# Patient Record
Sex: Male | Born: 1965
Health system: Southern US, Community
[De-identification: ages and names within clinical notes are randomized; demographics above are authoritative.]

## PROBLEM LIST (undated history)

## (undated) DIAGNOSIS — G4733 Obstructive sleep apnea (adult) (pediatric): Secondary | ICD-10-CM

## (undated) DIAGNOSIS — Z9989 Dependence on other enabling machines and devices: Secondary | ICD-10-CM

## (undated) DIAGNOSIS — I10 Essential (primary) hypertension: Secondary | ICD-10-CM

## (undated) DIAGNOSIS — G4730 Sleep apnea, unspecified: Secondary | ICD-10-CM

## (undated) DIAGNOSIS — Z9289 Personal history of other medical treatment: Secondary | ICD-10-CM

## (undated) DIAGNOSIS — M109 Gout, unspecified: Secondary | ICD-10-CM

## (undated) DIAGNOSIS — E119 Type 2 diabetes mellitus without complications: Secondary | ICD-10-CM

## (undated) HISTORY — DX: Type 2 diabetes mellitus without complications: E11.9

## (undated) HISTORY — DX: Gout, unspecified: M10.9

## (undated) HISTORY — DX: Dependence on other enabling machines and devices: Z99.89

## (undated) HISTORY — PX: NO PAST SURGERIES: SHX2092

## (undated) HISTORY — DX: Obstructive sleep apnea (adult) (pediatric): G47.33

## (undated) HISTORY — DX: Essential (primary) hypertension: I10

## (undated) HISTORY — DX: Personal history of other medical treatment: Z92.89

---

## 2010-06-12 DIAGNOSIS — Z9289 Personal history of other medical treatment: Secondary | ICD-10-CM

## 2010-06-12 HISTORY — DX: Personal history of other medical treatment: Z92.89

## 2011-01-03 DIAGNOSIS — M109 Gout, unspecified: Secondary | ICD-10-CM | POA: Insufficient documentation

## 2012-07-01 ENCOUNTER — Other Ambulatory Visit: Payer: Self-pay

## 2012-07-01 DIAGNOSIS — M8430XA Stress fracture, unspecified site, initial encounter for fracture: Secondary | ICD-10-CM

## 2012-07-03 ENCOUNTER — Ambulatory Visit
Admission: RE | Admit: 2012-07-03 | Discharge: 2012-07-03 | Disposition: A | Discharge status: Home / Self Care | Payer: Federal, State, Local not specified - PPO | Source: Ambulatory Visit

## 2012-07-03 DIAGNOSIS — M8430XA Stress fracture, unspecified site, initial encounter for fracture: Secondary | ICD-10-CM

## 2013-01-16 ENCOUNTER — Other Ambulatory Visit (HOSPITAL_COMMUNITY): Payer: Self-pay | Admitting: Family Medicine

## 2013-01-16 DIAGNOSIS — I209 Angina pectoris, unspecified: Secondary | ICD-10-CM

## 2013-01-20 ENCOUNTER — Telehealth (HOSPITAL_COMMUNITY): Payer: Self-pay | Admitting: Family Medicine

## 2013-01-20 NOTE — Telephone Encounter (Signed)
Left message for patient to call back to r/s stress test ordered by Dr. Clarene Duke. Also left message on patients mobile number

## 2013-01-29 ENCOUNTER — Emergency Department (HOSPITAL_COMMUNITY): Payer: Federal, State, Local not specified - PPO

## 2013-01-29 ENCOUNTER — Encounter (HOSPITAL_COMMUNITY): Payer: Self-pay | Admitting: *Deleted

## 2013-01-29 ENCOUNTER — Emergency Department (HOSPITAL_COMMUNITY)
Admission: EM | Admit: 2013-01-29 | Discharge: 2013-01-29 | Disposition: A | Discharge status: Home / Self Care | Payer: Federal, State, Local not specified - PPO | Attending: Emergency Medicine | Admitting: Emergency Medicine

## 2013-01-29 DIAGNOSIS — Z8669 Personal history of other diseases of the nervous system and sense organs: Secondary | ICD-10-CM | POA: Insufficient documentation

## 2013-01-29 DIAGNOSIS — R0789 Other chest pain: Secondary | ICD-10-CM | POA: Insufficient documentation

## 2013-01-29 DIAGNOSIS — Z79899 Other long term (current) drug therapy: Secondary | ICD-10-CM | POA: Insufficient documentation

## 2013-01-29 DIAGNOSIS — R079 Chest pain, unspecified: Secondary | ICD-10-CM

## 2013-01-29 HISTORY — DX: Sleep apnea, unspecified: G47.30

## 2013-01-29 LAB — CBC
Platelets: 161 10*3/uL (ref 150–400)
RBC: 4.93 MIL/uL (ref 4.22–5.81)
WBC: 10.5 10*3/uL (ref 4.0–10.5)

## 2013-01-29 LAB — BASIC METABOLIC PANEL
CO2: 28 mEq/L (ref 19–32)
Chloride: 106 mEq/L (ref 96–112)
Potassium: 4.5 mEq/L (ref 3.5–5.1)
Sodium: 143 mEq/L (ref 135–145)

## 2013-01-29 LAB — POCT I-STAT TROPONIN I: Troponin i, poc: 0 ng/mL (ref 0.00–0.08)

## 2013-01-29 NOTE — ED Provider Notes (Signed)
Medical screening examination/treatment/procedure(s) were performed by non-physician practitioner and as supervising physician I was immediately available for consultation/collaboration.  Shon Baton, MD 01/29/13 [phone]

## 2013-01-29 NOTE — ED Provider Notes (Signed)
CSN: [id number]     Arrival date & time 01/29/13  1153 History     First MD Initiated Contact with Patient 01/29/13 1200     Chief Complaint  Patient presents with  . Chest Pain   (Consider location/radiation/quality/duration/timing/severity/associated sxs/prior Treatment) HPI Pt is a 47yo male with hx of sleep apnea c/o chest pain that is sharp in nature, right upper chest, lasts "a millisecond." Pt experienced 3 episodes of sharp pain today while sitting at computer.  Also reports episode of similar pain 2 weeks ago, was seen by PCP, Dr. Aida Puffer, who performed an EKG and scheduled him for stress test on Tuesday, 8/27 with [facility] but stated he wanted to be reevaluated before then due to pain returning.  Denies fever, n/v, diaphoresis, SOB. Denies chest trauma.  No significant FH for CAD.   Past Medical History  Diagnosis Date  . Sleep apnea    History reviewed. No pertinent past surgical history. History reviewed. No pertinent family history. History  Substance Use Topics  . Smoking status: Not on file  . Smokeless tobacco: Not on file  . Alcohol Use: No    Review of Systems  Constitutional: Negative for fever, chills, diaphoresis and fatigue.  Respiratory: Negative for cough and shortness of breath.   Cardiovascular: Positive for chest pain. Negative for palpitations and leg swelling.  Gastrointestinal: Negative for nausea and vomiting.  Neurological: Negative for dizziness and light-headedness.  All other systems reviewed and are negative.    Allergies  Nasacort  Home Medications   Current Outpatient Rx  Name  Route  Sig  Dispense  Refill  . allopurinol (ZYLOPRIM) 300 MG tablet   Oral   Take 450 mg by mouth at bedtime.         . colchicine 0.6 MG tablet   Oral   Take 0.6 mg by mouth at bedtime.          BP 135/79  Pulse 78  Temp(Src) 98.1 F (36.7 C) (Oral)  Resp 18  SpO2 96% Physical Exam  Nursing note and vitals  reviewed. Constitutional: He appears well-developed and well-nourished.  HENT:  Head: Normocephalic and atraumatic.  Eyes: Conjunctivae are normal. No scleral icterus.  Neck: Normal range of motion.  Cardiovascular: Normal rate, regular rhythm and normal heart sounds.   Pulmonary/Chest: Effort normal and breath sounds normal. No respiratory distress. He has no wheezes. He has no rales. He exhibits no tenderness.  Abdominal: Soft. Bowel sounds are normal. He exhibits no distension and no mass. There is no tenderness. There is no rebound and no guarding.  Musculoskeletal: Normal range of motion.  Neurological: He is alert.  Skin: Skin is warm and dry.    ED Course   Procedures (including critical care time)  Labs Reviewed  BASIC METABOLIC PANEL - Abnormal; Notable for the following:    GFR calc non Af Amer 81 (*)    All other components within normal limits  CBC  POCT I-STAT TROPONIN I   Dg Chest 2 View  01/29/2013   *RADIOLOGY REPORT*  Clinical Data: Right-sided chest pain  CHEST - 2 VIEW  Comparison: None.  Findings: The heart and pulmonary vascularity are within normal limits.  The lungs are clear bilaterally.  No acute bony abnormality is seen.  IMPRESSION: No acute abnormality noted.   Original Report Authenticated By: Alcide Clever, M.D.    Date: 01/29/2013  Rate: 74  Rhythm: normal sinus rhythm  QRS Axis: normal  Intervals: normal  ST/T Wave abnormalities: normal  Conduction Disutrbances:none  Narrative Interpretation:   Old EKG Reviewed: none available    1. Chest pain     MDM  CP atypical for ACS.  Will still get cardiac workup.  Pt is PERC neg.  Denies trauma to chest, low concern for pneumothorax or other emergent process taking place at this time.  Troponin: neg.  EKG: nl CXR: no acute abnormality  Discussed pt with Dr. Wilkie Aye.  Will discharge pt home and have him f/u with PCP, Dr. Clarene Duke and continue with previously established appointment with [facility] for stress test on 8/17. Return precautions given. Pt verbalized understanding and agreement with tx plan. Vitals: unremarkable. Discharged in stable condition.    Discussed pt with attending during ED encounter.    Junius Finner, PA-C 01/29/13 1620

## 2013-01-29 NOTE — ED Notes (Signed)
Pt reports having right side sharp cps and has been to pcp for it. They scheduled him for stress test next tues but pt having return of right side chest pains this am. ekg being done at triage.

## 2013-01-30 ENCOUNTER — Encounter (HOSPITAL_COMMUNITY): Payer: Federal, State, Local not specified - PPO

## 2013-01-31 ENCOUNTER — Ambulatory Visit (INDEPENDENT_AMBULATORY_CARE_PROVIDER_SITE_OTHER): Payer: Federal, State, Local not specified - PPO | Admitting: Cardiology

## 2013-01-31 ENCOUNTER — Encounter: Payer: Self-pay | Admitting: Cardiology

## 2013-01-31 VITALS — BP 134/88 | HR 89 | Ht 70.0 in | Wt 240.5 lb

## 2013-01-31 DIAGNOSIS — R079 Chest pain, unspecified: Secondary | ICD-10-CM

## 2013-01-31 DIAGNOSIS — G4733 Obstructive sleep apnea (adult) (pediatric): Secondary | ICD-10-CM

## 2013-01-31 NOTE — Progress Notes (Signed)
HPI:  47 year old white married male with a history of sleep apnea was seen by first his primary care physician and arrange for stress test in our office and then because of recurrent pain and was seen by the emergency room for chest pain.  Described as sharp shooting pain little stabs in his right pectoral area. No associated symptoms of nausea vomiting or diaphoresis. No recent chest trauma no colds or fevers. He saw his primary care Dr. Aida Puffer who performed an EKG. Patient is concerned that it cardiac. Through the ER  there was an appointment arranged for him to be seen today in our office.  His troponin was negative in the emergency room and other labs were normal.  When he describes the pain to me, he states it's worse if he reaches to move something with his right arm, he'll get the pain more frequently.  It does occur when he is not moving at other times.  He has no premature history of coronary disease in his family.  Dr. Clarene Duke did place him on metoprolol tartrate 50 mg twice a day.  Allergies  Allergen Reactions  . Nasacort [Triamcinolone] Hives    Current Outpatient Prescriptions  Medication Sig Dispense Refill  . allopurinol (ZYLOPRIM) 300 MG tablet Take 450 mg by mouth at bedtime.      . colchicine 0.6 MG tablet Take 0.6 mg by mouth at bedtime.      Marland Kitchen METOPROLOL TARTRATE PO Take 50 mg by mouth 2 (two) times daily.        No current facility-administered medications for this visit.    Past Medical History  Diagnosis Date  . Gout   . Obstructive sleep apnea on CPAP   . H/O exercise stress test 2012    normal    History reviewed. No pertinent past surgical history.  Family History  Problem Relation Age of Onset  . Heart failure Father     mini-stroke?  Marland Kitchen Heart attack Father   . Diabetes Maternal Grandmother   . Heart Problems Maternal Grandmother   . Heart failure Maternal Grandmother   . Healthy Mother   . Healthy Brother   . Heart attack Maternal  Grandfather   . Stroke Maternal Grandfather     History   Social History  . Marital Status: Married    Spouse Name: N/A    Number of Children: 0  . Years of Education: 12   Occupational History  . vehicle service attendant Other    [employer]   Social History Main Topics  . Smoking status: Never Smoker   . Smokeless tobacco: Never Used  . Alcohol Use: Yes     Comment: occasionally   . Drug Use: No  . Sexual Activity: Not on file   Other Topics Concern  . Not on file   Social History Narrative  . No narrative on file    ZOX:WRUEAVW:UJ colds or fevers, no weight changes Skin:no rashes or ulcers HEENT:no blurred vision, no congestion CV:see HPI PUL:see HPI GI:no diarrhea constipation or melena, no indigestion GU:no hematuria, no dysuria MS:no joint pain, no claudication, rt foot pain from fx toes, Lt foot pain with gout all improving Neuro:no syncope, no lightheadedness Endo:no diabetes, no thyroid disease   PHYSICAL EXAM:General:Pleasant affect, NAD Skin:Warm and dry, brisk capillary refill HEENT:normocephalic, sclera clear, mucus membranes moist Neck:supple, no JVD, no bruits  Heart:S1S2 RRR without murmur, gallup, rub or click Lungs:clear without rales, rhonchi, or wheezes WJX:BJYN, non tender, +  BS, do not palpate liver spleen or masses Ext:no lower ext edema, 2+ pedal pulses, 2+ radial pulses Neuro:alert and oriented, MAE, follows commands, + facial symmetry  BP 134/88  Pulse 89  Ht 5\' 10"  (1.778 m)  Wt 240 lb 8 oz (109.09 kg)  BMI 34.51 kg/m2  EKG:SR normal EKG  ASSESSMENT AND PLAN Chest pain Chest pain occurs at rest and with exertion but stretching with his right arm causes pain. This right-sided pain around right pecs, cannot reproduce with palpation.  EKG without acute changes from 2012.  Proceed with exercise stress test on Tuesday as planned the patient the option of following up with Dr. Royann Shivers or we will call results but he would prefer to  followup with Dr. Royann Shivers.  OSA on CPAP Does wear his CPAP.   I reviewed the EKG with Dr. Royann Shivers.  We agreed that he should proceed with stress test and he will followup with Dr. Royann Shivers.  We also discussed if the stress test is normal he needs to get active again now that his feet are healing from fractured toes and the gout improving on he needs to lose weight needs to exercise any more appropriately.

## 2013-01-31 NOTE — Assessment & Plan Note (Addendum)
Chest pain occurs at rest and with exertion but stretching with his right arm causes pain. This right-sided pain around right pecs, cannot reproduce with palpation.  EKG without acute changes from 2012.  Proceed with exercise stress test on Tuesday as planned the patient the option of following up with Dr. Royann Shivers or we will call results but he would prefer to followup with Dr. Royann Shivers.

## 2013-01-31 NOTE — Assessment & Plan Note (Signed)
Does wear his CPAP.

## 2013-01-31 NOTE — Patient Instructions (Signed)
Continue with stress test.  Follow up with Dr. Royann Shivers for results.  Try Ibuprofen if needed for the pain.

## 2013-02-03 ENCOUNTER — Ambulatory Visit: Payer: Federal, State, Local not specified - PPO | Admitting: Cardiology

## 2013-02-04 ENCOUNTER — Ambulatory Visit (HOSPITAL_COMMUNITY)
Admission: RE | Admit: 2013-02-04 | Discharge: 2013-02-04 | Disposition: A | Discharge status: Home / Self Care | Payer: Federal, State, Local not specified - PPO | Source: Ambulatory Visit | Attending: Cardiovascular Disease | Admitting: Cardiovascular Disease

## 2013-02-04 DIAGNOSIS — I209 Angina pectoris, unspecified: Secondary | ICD-10-CM | POA: Insufficient documentation

## 2013-02-18 ENCOUNTER — Ambulatory Visit (INDEPENDENT_AMBULATORY_CARE_PROVIDER_SITE_OTHER): Payer: Federal, State, Local not specified - PPO | Admitting: Cardiovascular Disease

## 2013-02-18 ENCOUNTER — Encounter: Payer: Self-pay | Admitting: Cardiovascular Disease

## 2013-02-18 VITALS — BP 138/86 | HR 88 | Resp 16 | Ht 70.0 in | Wt 247.6 lb

## 2013-02-18 DIAGNOSIS — R079 Chest pain, unspecified: Secondary | ICD-10-CM

## 2013-02-18 DIAGNOSIS — G4733 Obstructive sleep apnea (adult) (pediatric): Secondary | ICD-10-CM

## 2013-02-18 NOTE — Patient Instructions (Signed)
Your physician recommends that you call or return to clinic prn if these symptoms worsen or fail to improve as anticipated. Your physician encouraged you to lose weight for better health. Your physician discussed the importance of regular exercise and recommended that you start or continue a regular exercise program for good health.

## 2013-02-19 ENCOUNTER — Encounter: Payer: Self-pay | Admitting: Cardiovascular Disease

## 2013-02-19 NOTE — Assessment & Plan Note (Signed)
His chest pain is likely noncardiac and his stress test was normal in fact he had pretty good exercise tolerance exercising for almost 10 minutes and a sinus Bruce protocol. He does not have evidence of hypertension or diabetes, but is moderate to severely obese. I do not think further evaluation for cardiac illness is necessary at this time, have taken the opportunity to discuss weight loss, regular physical exercise, healthy diet in a lot of detail. I think he would do best with a high protein high in saturated fat diet with low carbohydrate intake, this needs to be done cautiously since she also has gout. He needs to avoid meat and shellfish. Good sources of protein without increasing his uric acid would be low-fat dairy products, egg whites, legumes, and nuts.

## 2013-02-19 NOTE — Assessment & Plan Note (Signed)
Substantial weight loss might also benefit this disorder

## 2013-02-19 NOTE — Progress Notes (Signed)
Patient ID: Steven Giles, male   DOB: [birth year], 47 y.o.   MRN: [id number]     Reason for office visit Chest pain; followup stressed  Steven Giles is a obese 47 year old man with obstructive sleep apnea and gout returns in followup after undergoing a stress test for chest pain. He did quite well exercising for over 9 minutes without any chest pain or EKG changes. His description of his chest pain is highly consistent with musculoskeletal etiology in my opinion as well. He did not hurt while on the treadmill and has not had any recurrence of the chest and sent    Allergies  Allergen Reactions  . Nasacort [Triamcinolone] Hives    Current Outpatient Prescriptions  Medication Sig Dispense Refill  . allopurinol (ZYLOPRIM) 300 MG tablet Take 450 mg by mouth at bedtime.      . colchicine 0.6 MG tablet Take 0.6 mg by mouth at bedtime.      Marland Kitchen NITROSTAT 0.4 MG SL tablet as needed.      Marland Kitchen METOPROLOL TARTRATE PO Take 50 mg by mouth 2 (two) times daily.        No current facility-administered medications for this visit.    Past Medical History  Diagnosis Date  . Gout   . Obstructive sleep apnea on CPAP   . H/O exercise stress test 2012    normal    No past surgical history on file.  Family History  Problem Relation Age of Onset  . Heart failure Father     mini-stroke?  Marland Kitchen Heart attack Father   . Diabetes Maternal Grandmother   . Heart Problems Maternal Grandmother   . Heart failure Maternal Grandmother   . Healthy Mother   . Healthy Brother   . Heart attack Maternal Grandfather   . Stroke Maternal Grandfather     History   Social History  . Marital Status: Married    Spouse Name: N/A    Number of Children: 0  . Years of Education: 12   Occupational History  . vehicle service attendant Other    [employer]   Social History Main Topics  . Smoking status: Never Smoker   . Smokeless tobacco: Never Used  . Alcohol Use: Yes     Comment: occasionally   . Drug Use: No  . Sexual  Activity: Not on file   Other Topics Concern  . Not on file   Social History Narrative  . No narrative on file    Review of systems: The patient specifically denies any chest pain at rest or with exertion, dyspnea at rest or with exertion, orthopnea, paroxysmal nocturnal dyspnea, syncope, palpitations, focal neurological deficits, intermittent claudication, lower extremity edema, unexplained weight gain, cough, hemoptysis or wheezing.  The patient also denies abdominal pain, nausea, vomiting, dysphagia, diarrhea, constipation, polyuria, polydipsia, dysuria, hematuria, frequency, urgency, abnormal bleeding or bruising, fever, chills, unexpected weight changes, mood swings, change in skin or hair texture, change in voice quality, auditory or visual problems, allergic reactions or rashes, new musculoskeletal complaints other than usual "aches and pains".   PHYSICAL EXAM BP 138/86  Pulse 88  Resp 16  Ht 5\' 10"  (1.778 m)  Wt 247 lb 9.6 oz (112.311 kg)  BMI 35.53 kg/m2  General: Alert, oriented x3, no distress; obese Head: no evidence of trauma, PERRL, EOMI, no exophtalmos or lid lag, no myxedema, no xanthelasma; normal ears, nose and crowded oropharynx Neck: normal jugular venous pulsations and no hepatojugular reflux; brisk carotid pulses without delay and no  carotid bruits Chest: clear to auscultation, no signs of consolidation by percussion or palpation, normal fremitus, symmetrical and full respiratory excursions Cardiovascular: normal position and quality of the apical impulse, regular rhythm, normal first and second heart sounds, no murmurs, rubs or gallops Abdomen: no tenderness or distention, no masses by palpation, no abnormal pulsatility or arterial bruits, normal bowel sounds, no hepatosplenomegaly Extremities: no clubbing, cyanosis or edema; 2+ radial, ulnar and brachial pulses bilaterally; 2+ right femoral, posterior tibial and dorsalis pedis pulses; 2+ left femoral, posterior  tibial and dorsalis pedis pulses; no subclavian or femoral bruits Neurological: grossly nonfocal   EKG: Normal sinus rhythm  Lipid Panel  No results found for this basename: chol, trig, hdl, cholhdl, vldl, ldlcalc    BMET    Component Value Date/Time   NA 143 01/29/2013 1201   K 4.5 01/29/2013 1201   CL 106 01/29/2013 1201   CO2 28 01/29/2013 1201   GLUCOSE 93 01/29/2013 1201   BUN 18 01/29/2013 1201   CREATININE 1.09 01/29/2013 1201   CALCIUM 9.7 01/29/2013 1201   GFRNONAA 81* 01/29/2013 1201   GFRAA >90 01/29/2013 1201     ASSESSMENT AND PLAN Chest pain His chest pain is likely noncardiac and his stress test was normal in fact he had pretty good exercise tolerance exercising for almost 10 minutes and a sinus Bruce protocol. He does not have evidence of hypertension or diabetes, but is moderate to severely obese. I do not think further evaluation for cardiac illness is necessary at this time, have taken the opportunity to discuss weight loss, regular physical exercise, healthy diet in a lot of detail. I think he would do best with a high protein high in saturated fat diet with low carbohydrate intake, this needs to be done cautiously since she also has gout. He needs to avoid meat and shellfish. Good sources of protein without increasing his uric acid would be low-fat dairy products, egg whites, legumes, and nuts.  OSA on CPAP Substantial weight loss might also benefit this disorder  No orders of the defined types were placed in this encounter.   Meds ordered this encounter  Medications  . NITROSTAT 0.4 MG SL tablet    Sig: as needed.    Junious Silk, MD, [facility] [phone] office [phone] pager

## 2013-04-09 ENCOUNTER — Other Ambulatory Visit: Payer: Self-pay | Admitting: Rheumatology

## 2013-04-09 DIAGNOSIS — R7989 Other specified abnormal findings of blood chemistry: Secondary | ICD-10-CM

## 2013-04-11 ENCOUNTER — Ambulatory Visit
Admission: RE | Admit: 2013-04-11 | Discharge: 2013-04-11 | Disposition: A | Discharge status: Home / Self Care | Payer: Federal, State, Local not specified - PPO | Source: Ambulatory Visit | Attending: Rheumatology | Admitting: Rheumatology

## 2013-04-11 DIAGNOSIS — R7989 Other specified abnormal findings of blood chemistry: Secondary | ICD-10-CM

## 2013-04-17 ENCOUNTER — Other Ambulatory Visit: Payer: Self-pay

## 2013-04-30 ENCOUNTER — Ambulatory Visit (INDEPENDENT_AMBULATORY_CARE_PROVIDER_SITE_OTHER): Payer: Federal, State, Local not specified - PPO

## 2013-04-30 VITALS — BP 129/85 | HR 76 | Resp 16 | Ht 70.0 in | Wt 245.0 lb

## 2013-04-30 DIAGNOSIS — M7750 Other enthesopathy of unspecified foot: Secondary | ICD-10-CM

## 2013-04-30 DIAGNOSIS — S93609A Unspecified sprain of unspecified foot, initial encounter: Secondary | ICD-10-CM

## 2013-04-30 DIAGNOSIS — R52 Pain, unspecified: Secondary | ICD-10-CM

## 2013-04-30 DIAGNOSIS — M7751 Other enthesopathy of right foot: Secondary | ICD-10-CM

## 2013-04-30 NOTE — Patient Instructions (Signed)

## 2013-04-30 NOTE — Progress Notes (Signed)
  Subjective:    Patient ID: Steven Giles, male    DOB: Apr 19, 1966, 47 y.o.   MRN: [id number] "I stepped down on it wrong on it Sunday.  I want to make sure it's not fractured, it bothers me."  Foot Pain This is a new problem. The current episode started in the past 7 days. The problem occurs intermittently. The problem has been gradually improving. Associated symptoms include arthralgias. The symptoms are aggravated by walking. He has tried NSAIDs (soaked it in [facility] water) for the symptoms. The treatment provided moderate relief.      Review of Systems  Constitutional: Negative.   HENT: Negative.   Eyes: Negative.   Respiratory: Positive for apnea.   Cardiovascular: Negative.   Gastrointestinal: Negative.   Endocrine: Negative.   Genitourinary: Negative.   Musculoskeletal: Positive for arthralgias.  Skin: Negative.   Allergic/Immunologic: Negative.   Neurological: Negative.   Hematological: Negative.   Psychiatric/Behavioral: Negative.        Objective:   Physical Exam Neurovascular status is intact with pedal pulses palpable epicritic and proprioceptive sensations intact and symmetric. Patient has pain and points to lateral column for fifth metatarsal base and cuboid articulation it are really bad for about 2 or 3 days. However today feeling much better. X-rays AP lateral oblique views demonstrate some subluxation of Lisfranc for fifth metatarsal base and cuboid articulation no sign of fracture identified at this time no cysts tumors no other osseous abnormalities. Slight tenderness at the base of fourth and fifth metatarsal and cuboid noted     Assessment & Plan:  Assessment this time sprain midfoot/Lisfranc's dislocation fourth fifth metatarsal base and cuboid articulation right foot. Plan at this time maintain a stiff soled shoe recommended ice to the affected area use over-the-counter NSAIDs or Tylenol as needed for pain. Followup if there's any exacerbations or changes  in the interim. Patient is still having some slight tenderness and occasional pain with the stress fracture of his contralateral left foot maintain a stiff soled shoe on that foot as well. Discharge to an as-needed basis for followup  Alvan Dame DPM

## 2014-03-27 ENCOUNTER — Other Ambulatory Visit: Payer: Self-pay

## 2015-04-12 IMAGING — US US ABDOMEN LIMITED
1 series · 14 of 25 positions shown · non-contrast
Comparison: None.

CLINICAL DATA: Elevated liver function tests.

EXAM:
US ABDOMEN LIMITED - RIGHT UPPER QUADRANT

[Series 1: us abdomen limited · 0.35mm/px · 14 of 37 slices shown]
[im 1/37]
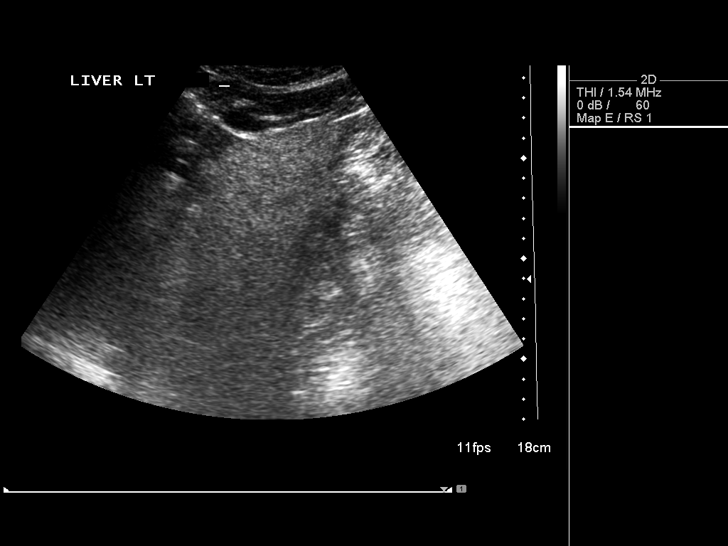
[im 4/37]
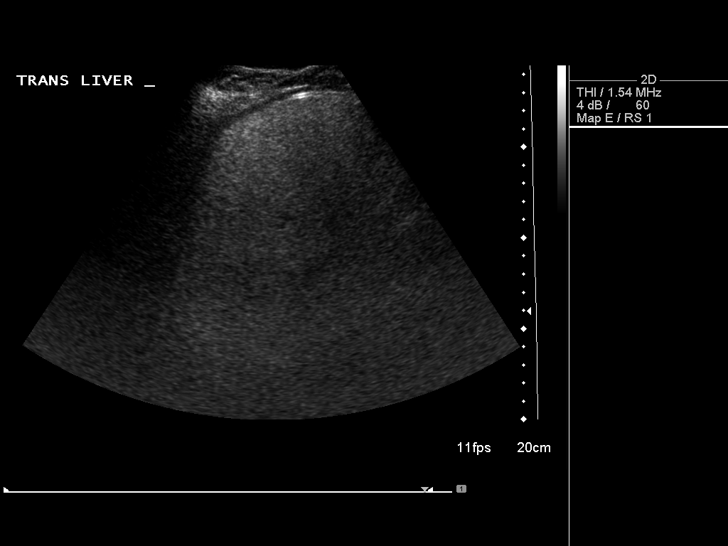
[im 7/37]
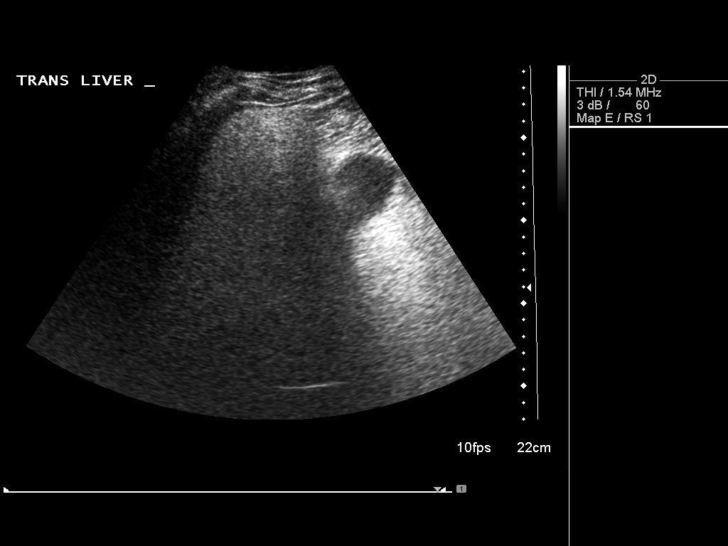
[im 10/37]
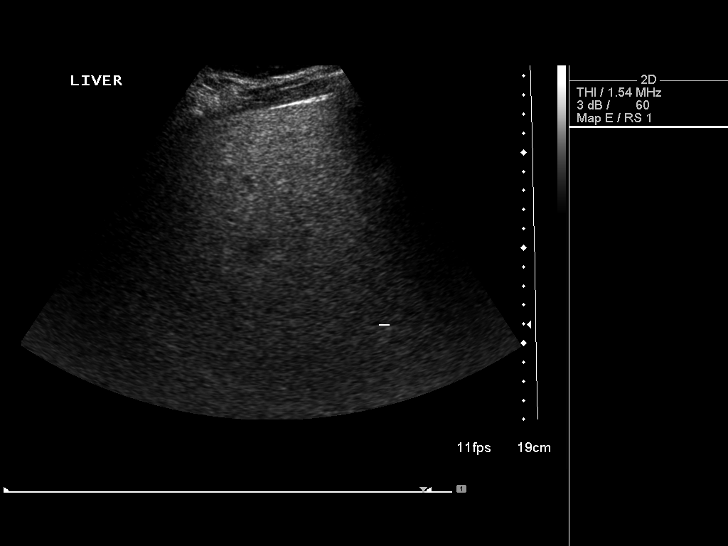
[im 13/37]
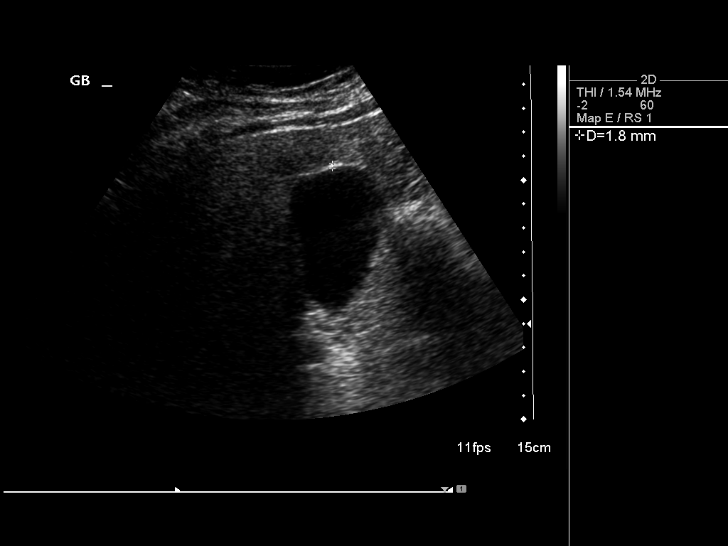
[im 14/37]
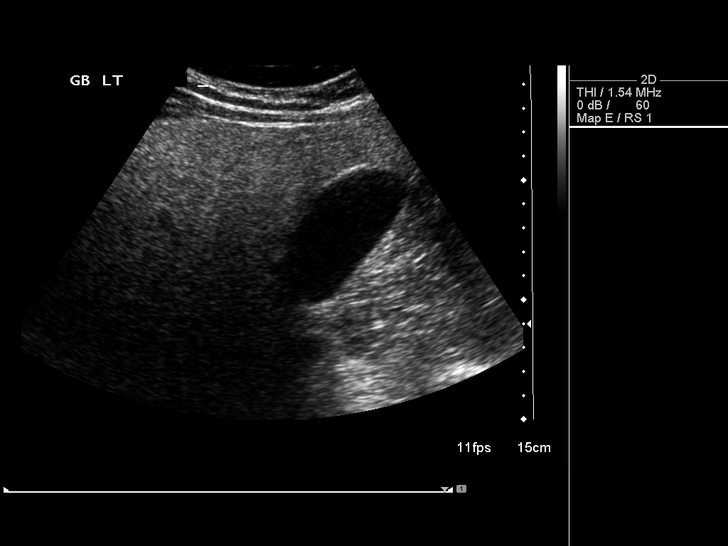
[im 17/37]
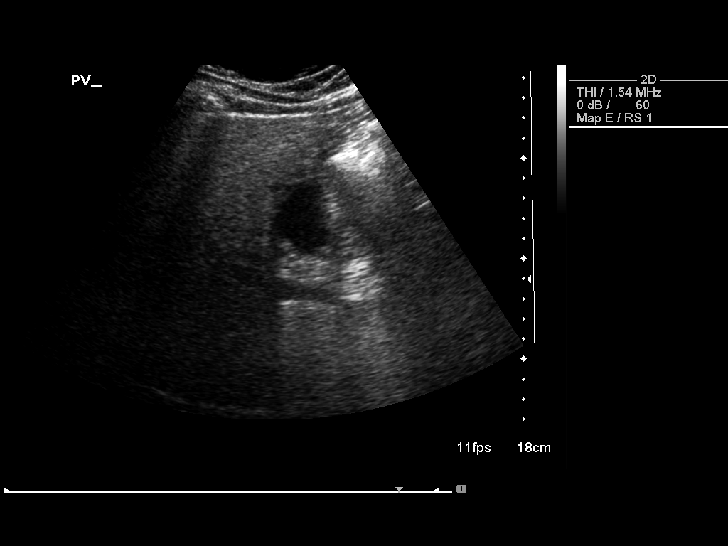
[im 20/37]
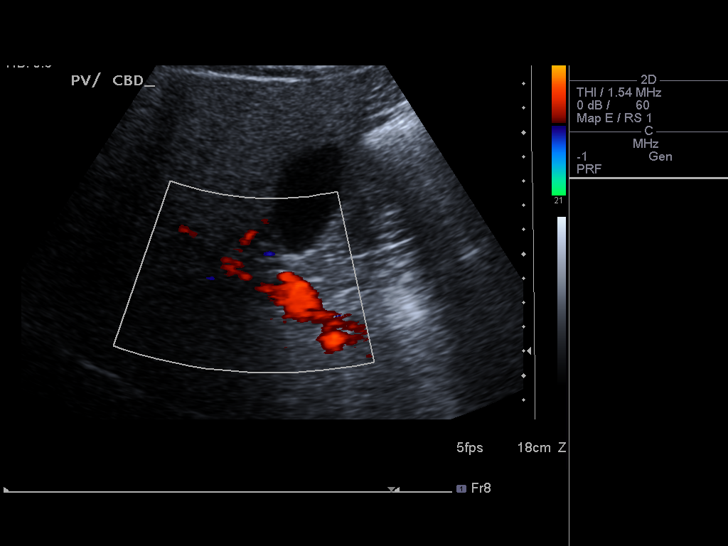
[im 23/37]
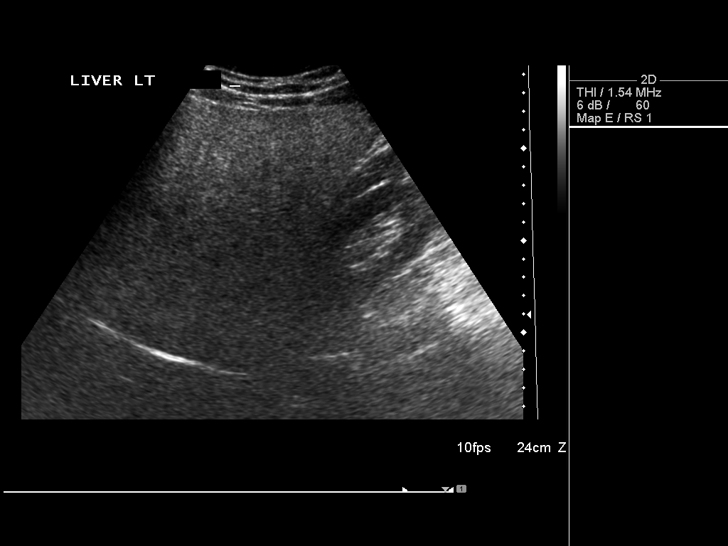
[im 25/37]
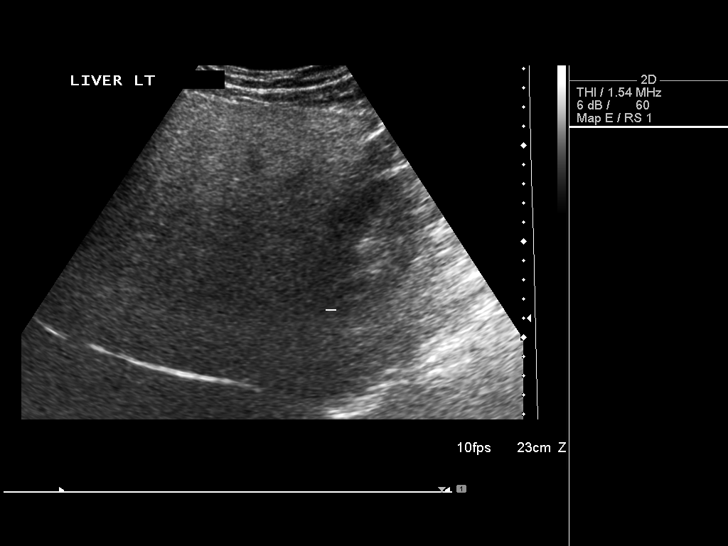
[im 28/37]
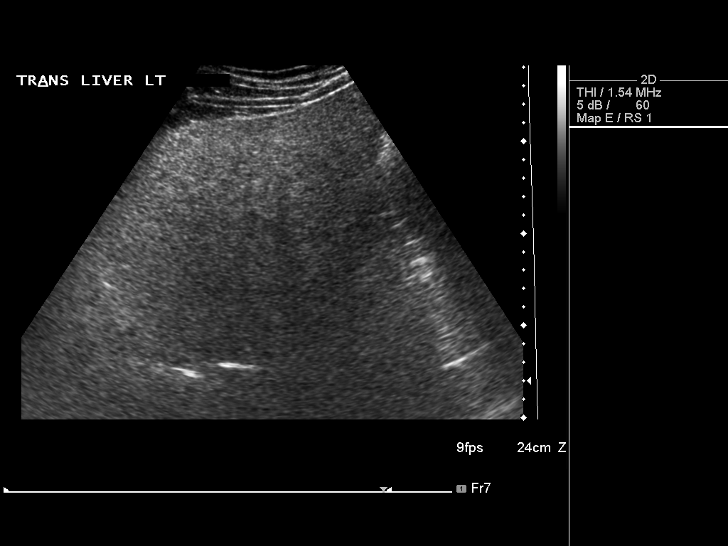
[im 31/37]
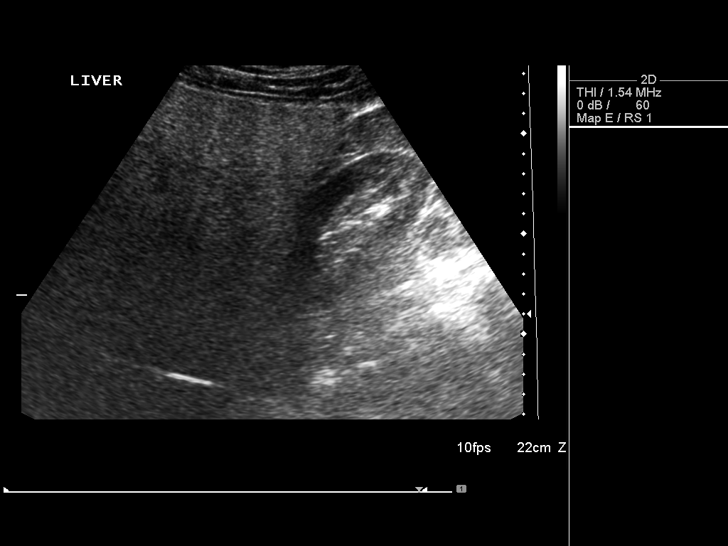
[im 34/37]
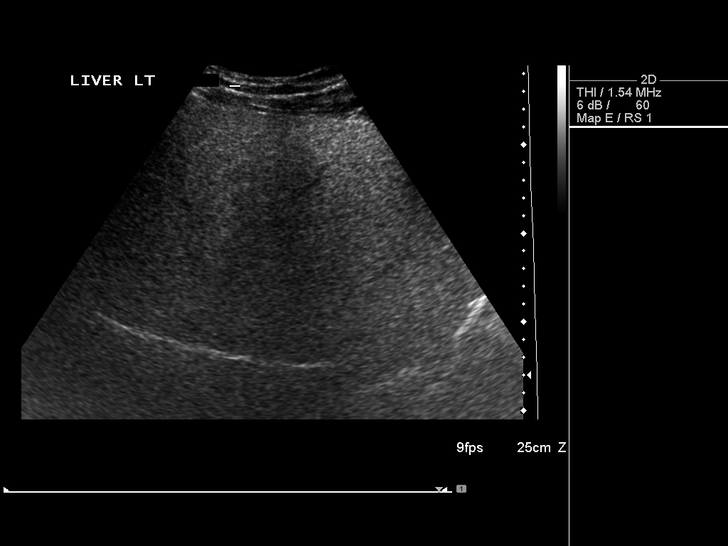
[im 37/37]
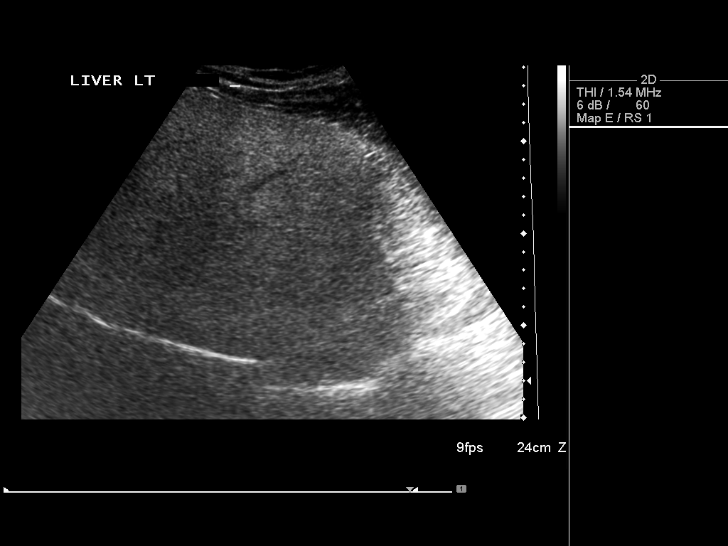

[14 of 25 positions shown; findings below may reference images not displayed]

FINDINGS: Gallbladder

No gallstones or wall thickening visualized. No sonographic Murphy
sign noted.

Common bile duct

Diameter: 3.3 mm, normal.

Liver:

No focal lesions. Echogenic liver parenchyma consistent with hepatic
steatosis.
IMPRESSION: Hepatic steatosis.

## 2016-08-01 DIAGNOSIS — K5900 Constipation, unspecified: Secondary | ICD-10-CM | POA: Diagnosis not present

## 2016-08-01 DIAGNOSIS — K649 Unspecified hemorrhoids: Secondary | ICD-10-CM | POA: Diagnosis not present

## 2017-03-06 DIAGNOSIS — G4730 Sleep apnea, unspecified: Secondary | ICD-10-CM | POA: Diagnosis not present

## 2017-03-08 DIAGNOSIS — Z79899 Other long term (current) drug therapy: Secondary | ICD-10-CM | POA: Diagnosis not present

## 2017-03-08 DIAGNOSIS — R7989 Other specified abnormal findings of blood chemistry: Secondary | ICD-10-CM | POA: Diagnosis not present

## 2017-03-08 DIAGNOSIS — M1A09X0 Idiopathic chronic gout, multiple sites, without tophus (tophi): Secondary | ICD-10-CM | POA: Diagnosis not present

## 2017-05-22 DIAGNOSIS — M25552 Pain in left hip: Secondary | ICD-10-CM | POA: Diagnosis not present

## 2017-05-22 DIAGNOSIS — S79912A Unspecified injury of left hip, initial encounter: Secondary | ICD-10-CM | POA: Diagnosis not present

## 2017-09-25 ENCOUNTER — Emergency Department (HOSPITAL_BASED_OUTPATIENT_CLINIC_OR_DEPARTMENT_OTHER): Payer: Federal, State, Local not specified - PPO

## 2017-09-25 ENCOUNTER — Encounter (HOSPITAL_BASED_OUTPATIENT_CLINIC_OR_DEPARTMENT_OTHER): Payer: Self-pay | Admitting: Emergency Medicine

## 2017-09-25 ENCOUNTER — Other Ambulatory Visit: Payer: Self-pay

## 2017-09-25 ENCOUNTER — Emergency Department (HOSPITAL_BASED_OUTPATIENT_CLINIC_OR_DEPARTMENT_OTHER)
Admission: EM | Admit: 2017-09-25 | Discharge: 2017-09-25 | Disposition: A | Discharge status: Home / Self Care | Payer: Federal, State, Local not specified - PPO | Attending: Emergency Medicine | Admitting: Emergency Medicine

## 2017-09-25 DIAGNOSIS — Y9389 Activity, other specified: Secondary | ICD-10-CM | POA: Insufficient documentation

## 2017-09-25 DIAGNOSIS — Y92410 Unspecified street and highway as the place of occurrence of the external cause: Secondary | ICD-10-CM | POA: Diagnosis not present

## 2017-09-25 DIAGNOSIS — S39012A Strain of muscle, fascia and tendon of lower back, initial encounter: Secondary | ICD-10-CM

## 2017-09-25 DIAGNOSIS — M545 Low back pain: Secondary | ICD-10-CM | POA: Diagnosis not present

## 2017-09-25 DIAGNOSIS — Z79899 Other long term (current) drug therapy: Secondary | ICD-10-CM | POA: Insufficient documentation

## 2017-09-25 DIAGNOSIS — S3992XA Unspecified injury of lower back, initial encounter: Secondary | ICD-10-CM | POA: Diagnosis not present

## 2017-09-25 DIAGNOSIS — Y999 Unspecified external cause status: Secondary | ICD-10-CM | POA: Diagnosis not present

## 2017-09-25 MED ORDER — METHOCARBAMOL 500 MG PO TABS
1000.0000 mg | ORAL_TABLET | Freq: Once | ORAL | Status: AC
  Administered 2017-09-25: 1000 mg via ORAL
  Filled 2017-09-25: qty 2

## 2017-09-25 MED ORDER — TRAMADOL HCL 50 MG PO TABS: 50.0000 mg | ORAL_TABLET | Freq: Four times a day (QID) | ORAL | 0 refills | Status: DC | PRN

## 2017-09-25 MED ORDER — NAPROXEN 500 MG PO TABS: 500.0000 mg | ORAL_TABLET | Freq: Two times a day (BID) | ORAL | 0 refills | Status: DC

## 2017-09-25 MED ORDER — METHOCARBAMOL 500 MG PO TABS: 500.0000 mg | ORAL_TABLET | Freq: Three times a day (TID) | ORAL | 0 refills | Status: DC | PRN

## 2017-09-25 MED FILL — traMADol HCL 50 MG TABS: 50 | 4 days supply | Qty: 15 | Fill #0

## 2017-09-25 MED FILL — NAPROXEN 500 MG TABLET: 500 | 15 days supply | Qty: 30 | Fill #0

## 2017-09-25 MED FILL — METHOCARBAMOL 500 MG TABLET: 500 | 7 days supply | Qty: 20 | Fill #0
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
## 2017-09-25 NOTE — Discharge Instructions (Addendum)
Ice to painful areas today.  Avoid heat until 48 hours. Tramadol for pain.  Naproxen, anti-inflammatory as prescribed.  Robaxin 3 times per day for muscle spasm. Slowly increase your activity as symptoms allow. Expect increased stiffness and soreness tomorrow before improvement begins
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
## 2017-10-05 DIAGNOSIS — J302 Other seasonal allergic rhinitis: Secondary | ICD-10-CM | POA: Diagnosis not present

## 2017-10-16 DIAGNOSIS — G4730 Sleep apnea, unspecified: Secondary | ICD-10-CM | POA: Diagnosis not present

## 2017-10-25 DIAGNOSIS — F411 Generalized anxiety disorder: Secondary | ICD-10-CM | POA: Diagnosis not present

## 2017-10-25 DIAGNOSIS — I10 Essential (primary) hypertension: Secondary | ICD-10-CM | POA: Diagnosis not present

## 2017-10-25 DIAGNOSIS — F4310 Post-traumatic stress disorder, unspecified: Secondary | ICD-10-CM | POA: Diagnosis not present

## 2017-11-12 DIAGNOSIS — F411 Generalized anxiety disorder: Secondary | ICD-10-CM | POA: Diagnosis not present

## 2017-11-12 DIAGNOSIS — Z6836 Body mass index (BMI) 36.0-36.9, adult: Secondary | ICD-10-CM | POA: Diagnosis not present

## 2017-11-12 DIAGNOSIS — I10 Essential (primary) hypertension: Secondary | ICD-10-CM | POA: Diagnosis not present

## 2017-11-12 DIAGNOSIS — F4310 Post-traumatic stress disorder, unspecified: Secondary | ICD-10-CM | POA: Diagnosis not present

## 2017-11-15 ENCOUNTER — Ambulatory Visit: Payer: Federal, State, Local not specified - PPO | Admitting: Psychology

## 2017-11-15 DIAGNOSIS — F4310 Post-traumatic stress disorder, unspecified: Secondary | ICD-10-CM

## 2017-12-05 DIAGNOSIS — Z6837 Body mass index (BMI) 37.0-37.9, adult: Secondary | ICD-10-CM | POA: Diagnosis not present

## 2017-12-05 DIAGNOSIS — H6523 Chronic serous otitis media, bilateral: Secondary | ICD-10-CM | POA: Diagnosis not present

## 2017-12-05 DIAGNOSIS — R42 Dizziness and giddiness: Secondary | ICD-10-CM | POA: Diagnosis not present

## 2017-12-11 ENCOUNTER — Ambulatory Visit: Payer: Federal, State, Local not specified - PPO | Admitting: Psychology

## 2017-12-11 DIAGNOSIS — F4310 Post-traumatic stress disorder, unspecified: Secondary | ICD-10-CM

## 2017-12-18 DIAGNOSIS — R42 Dizziness and giddiness: Secondary | ICD-10-CM | POA: Diagnosis not present

## 2017-12-18 DIAGNOSIS — H6992 Unspecified Eustachian tube disorder, left ear: Secondary | ICD-10-CM | POA: Diagnosis not present

## 2017-12-18 DIAGNOSIS — H6523 Chronic serous otitis media, bilateral: Secondary | ICD-10-CM | POA: Diagnosis not present

## 2017-12-18 DIAGNOSIS — Z6836 Body mass index (BMI) 36.0-36.9, adult: Secondary | ICD-10-CM | POA: Diagnosis not present

## 2017-12-31 DIAGNOSIS — Z0000 Encounter for general adult medical examination without abnormal findings: Secondary | ICD-10-CM | POA: Diagnosis not present

## 2017-12-31 DIAGNOSIS — Z13220 Encounter for screening for lipoid disorders: Secondary | ICD-10-CM | POA: Diagnosis not present

## 2017-12-31 DIAGNOSIS — Z1329 Encounter for screening for other suspected endocrine disorder: Secondary | ICD-10-CM | POA: Diagnosis not present

## 2017-12-31 DIAGNOSIS — I10 Essential (primary) hypertension: Secondary | ICD-10-CM | POA: Diagnosis not present

## 2017-12-31 DIAGNOSIS — Z125 Encounter for screening for malignant neoplasm of prostate: Secondary | ICD-10-CM | POA: Diagnosis not present

## 2017-12-31 DIAGNOSIS — Z114 Encounter for screening for human immunodeficiency virus [HIV]: Secondary | ICD-10-CM | POA: Diagnosis not present

## 2018-01-07 DIAGNOSIS — Z6836 Body mass index (BMI) 36.0-36.9, adult: Secondary | ICD-10-CM | POA: Diagnosis not present

## 2018-01-07 DIAGNOSIS — Z0000 Encounter for general adult medical examination without abnormal findings: Secondary | ICD-10-CM | POA: Diagnosis not present

## 2018-02-07 DIAGNOSIS — Z6836 Body mass index (BMI) 36.0-36.9, adult: Secondary | ICD-10-CM | POA: Diagnosis not present

## 2018-02-07 DIAGNOSIS — E039 Hypothyroidism, unspecified: Secondary | ICD-10-CM | POA: Diagnosis not present

## 2018-02-07 DIAGNOSIS — R5381 Other malaise: Secondary | ICD-10-CM | POA: Diagnosis not present

## 2018-02-07 DIAGNOSIS — R42 Dizziness and giddiness: Secondary | ICD-10-CM | POA: Diagnosis not present

## 2018-02-07 DIAGNOSIS — R509 Fever, unspecified: Secondary | ICD-10-CM | POA: Diagnosis not present

## 2018-02-12 DIAGNOSIS — F4310 Post-traumatic stress disorder, unspecified: Secondary | ICD-10-CM | POA: Diagnosis not present

## 2018-02-12 DIAGNOSIS — R509 Fever, unspecified: Secondary | ICD-10-CM | POA: Diagnosis not present

## 2018-02-12 DIAGNOSIS — I10 Essential (primary) hypertension: Secondary | ICD-10-CM | POA: Diagnosis not present

## 2018-02-12 DIAGNOSIS — F411 Generalized anxiety disorder: Secondary | ICD-10-CM | POA: Diagnosis not present

## 2018-02-13 ENCOUNTER — Ambulatory Visit
Admission: RE | Admit: 2018-02-13 | Discharge: 2018-02-13 | Disposition: A | Discharge status: Home / Self Care | Payer: Federal, State, Local not specified - PPO | Source: Ambulatory Visit | Attending: Family Medicine | Admitting: Family Medicine

## 2018-02-13 ENCOUNTER — Other Ambulatory Visit: Payer: Self-pay | Admitting: Family Medicine

## 2018-02-13 DIAGNOSIS — R059 Cough, unspecified: Secondary | ICD-10-CM

## 2018-02-13 DIAGNOSIS — R05 Cough: Secondary | ICD-10-CM | POA: Diagnosis not present

## 2018-02-13 DIAGNOSIS — R509 Fever, unspecified: Secondary | ICD-10-CM

## 2018-02-18 DIAGNOSIS — Z6837 Body mass index (BMI) 37.0-37.9, adult: Secondary | ICD-10-CM | POA: Diagnosis not present

## 2018-02-18 DIAGNOSIS — R509 Fever, unspecified: Secondary | ICD-10-CM | POA: Diagnosis not present

## 2018-03-08 DIAGNOSIS — R7989 Other specified abnormal findings of blood chemistry: Secondary | ICD-10-CM | POA: Diagnosis not present

## 2018-03-08 DIAGNOSIS — Z79899 Other long term (current) drug therapy: Secondary | ICD-10-CM | POA: Diagnosis not present

## 2018-03-08 DIAGNOSIS — M1A09X0 Idiopathic chronic gout, multiple sites, without tophus (tophi): Secondary | ICD-10-CM | POA: Diagnosis not present

## 2018-03-11 DIAGNOSIS — Z6837 Body mass index (BMI) 37.0-37.9, adult: Secondary | ICD-10-CM | POA: Diagnosis not present

## 2018-03-11 DIAGNOSIS — L918 Other hypertrophic disorders of the skin: Secondary | ICD-10-CM | POA: Diagnosis not present

## 2018-03-11 DIAGNOSIS — Z23 Encounter for immunization: Secondary | ICD-10-CM | POA: Diagnosis not present

## 2018-03-21 DIAGNOSIS — L918 Other hypertrophic disorders of the skin: Secondary | ICD-10-CM | POA: Diagnosis not present

## 2018-03-21 DIAGNOSIS — Z6837 Body mass index (BMI) 37.0-37.9, adult: Secondary | ICD-10-CM | POA: Diagnosis not present

## 2018-07-29 DIAGNOSIS — L820 Inflamed seborrheic keratosis: Secondary | ICD-10-CM | POA: Diagnosis not present

## 2018-07-29 DIAGNOSIS — D229 Melanocytic nevi, unspecified: Secondary | ICD-10-CM | POA: Diagnosis not present

## 2018-08-05 DIAGNOSIS — Z6838 Body mass index (BMI) 38.0-38.9, adult: Secondary | ICD-10-CM | POA: Diagnosis not present

## 2018-08-05 DIAGNOSIS — I10 Essential (primary) hypertension: Secondary | ICD-10-CM | POA: Diagnosis not present

## 2018-08-18 ENCOUNTER — Other Ambulatory Visit: Payer: Self-pay

## 2018-08-18 ENCOUNTER — Emergency Department (HOSPITAL_BASED_OUTPATIENT_CLINIC_OR_DEPARTMENT_OTHER)
Admission: EM | Admit: 2018-08-18 | Discharge: 2018-08-18 | Disposition: A | Discharge status: Home / Self Care | Payer: Federal, State, Local not specified - PPO | Attending: Emergency Medicine | Admitting: Emergency Medicine

## 2018-08-18 ENCOUNTER — Encounter (HOSPITAL_BASED_OUTPATIENT_CLINIC_OR_DEPARTMENT_OTHER): Payer: Self-pay | Admitting: Emergency Medicine

## 2018-08-18 DIAGNOSIS — R350 Frequency of micturition: Secondary | ICD-10-CM | POA: Diagnosis not present

## 2018-08-18 DIAGNOSIS — R739 Hyperglycemia, unspecified: Secondary | ICD-10-CM | POA: Diagnosis not present

## 2018-08-18 LAB — CBC WITH DIFFERENTIAL/PLATELET
Abs Immature Granulocytes: 0.04 10*3/uL (ref 0.00–0.07)
Basophils Absolute: 0 10*3/uL (ref 0.0–0.1)
Basophils Relative: 0 %
EOS ABS: 0.3 10*3/uL (ref 0.0–0.5)
EOS PCT: 3 %
HCT: 45.4 % (ref 39.0–52.0)
Hemoglobin: 15.1 g/dL (ref 13.0–17.0)
IMMATURE GRANULOCYTES: 0 %
Lymphocytes Relative: 24 %
Lymphs Abs: 2.5 10*3/uL (ref 0.7–4.0)
MCH: 29.8 pg (ref 26.0–34.0)
MCHC: 33.3 g/dL (ref 30.0–36.0)
MCV: 89.5 fL (ref 80.0–100.0)
MONO ABS: 0.7 10*3/uL (ref 0.1–1.0)
MONOS PCT: 7 %
NEUTROS PCT: 66 %
Neutro Abs: 6.8 10*3/uL (ref 1.7–7.7)
Platelets: 180 10*3/uL (ref 150–400)
RBC: 5.07 MIL/uL (ref 4.22–5.81)
RDW: 12.3 % (ref 11.5–15.5)
WBC: 10.3 10*3/uL (ref 4.0–10.5)
nRBC: 0 % (ref 0.0–0.2)

## 2018-08-18 LAB — URINALYSIS, ROUTINE W REFLEX MICROSCOPIC
BILIRUBIN URINE: NEGATIVE
GLUCOSE, UA: 250 mg/dL — AB
HGB URINE DIPSTICK: NEGATIVE
KETONES UR: NEGATIVE mg/dL
Leukocytes,Ua: NEGATIVE
Nitrite: NEGATIVE
PROTEIN: NEGATIVE mg/dL
Specific Gravity, Urine: 1.02 (ref 1.005–1.030)
pH: 7 (ref 5.0–8.0)

## 2018-08-18 LAB — COMPREHENSIVE METABOLIC PANEL
ALBUMIN: 4.5 g/dL (ref 3.5–5.0)
ALT: 57 U/L — ABNORMAL HIGH (ref 0–44)
ANION GAP: 9 (ref 5–15)
AST: 45 U/L — AB (ref 15–41)
Alkaline Phosphatase: 107 U/L (ref 38–126)
BILIRUBIN TOTAL: 0.7 mg/dL (ref 0.3–1.2)
BUN: 17 mg/dL (ref 6–20)
CALCIUM: 9.3 mg/dL (ref 8.9–10.3)
CO2: 27 mmol/L (ref 22–32)
Chloride: 100 mmol/L (ref 98–111)
Creatinine, Ser: 1.09 mg/dL (ref 0.61–1.24)
GFR calc Af Amer: >60 mL/min (ref >60)
GFR calc non Af Amer: >60 mL/min (ref >60)
GLUCOSE: 202 mg/dL — AB (ref 70–99)
Potassium: 3.7 mmol/L (ref 3.5–5.1)
SODIUM: 136 mmol/L (ref 135–145)
TOTAL PROTEIN: 7.8 g/dL (ref 6.5–8.1)

## 2018-08-18 LAB — CBG MONITORING, ED: GLUCOSE-CAPILLARY: 238 mg/dL — AB (ref 70–99)

## 2018-08-18 NOTE — Discharge Instructions (Addendum)
As we discussed today here in the emergency department, your blood sugar was high.  This could reflect diabetes.  You need to arrange an appointment with your primary care doctor for further evaluation.  Additionally, your work-up today showed some mild increase in your liver functions.  Please have your primary care doctor repeat these tests to ensure that they are not increasing.  Return the emergency department for any abdominal pain, nausea/vomiting, chest pain, difficulty breathing.

## 2018-08-18 NOTE — ED Provider Notes (Signed)
[facility] DEPARTMENT Provider Note   CSN: [id number] Arrival date & time: 08/18/18  1545    History   Chief Complaint Chief Complaint  Patient presents with  . Urinary Frequency    HPI Steven Giles is a 53 y.o. male with past medical history of gout who presents for evaluation of increased urinary frequency and concerns for elevated blood sugar.  Patient reports that for the last 2 days, he has had increased urinary frequency.  He has not noted any hematuria or dysuria.  Patient states that he was worried about how much he was urinating so he checked his blood sugar yesterday after eating breakfast with his wife's meter.  At that time, it read approximately 400.  Patient states that throughout the day, he would keep checking it.  He states that it stayed high for a while and then went down and then after he ate, he went back up again.  Patient reports that today, he continued to check his blood sugar every hour and states that it eventually decreased to 160 and then to 120.  Patient reports that he ate a biscuit sandwich and then checked it again and noted his blood sugar to be in the 200s, prompting ED visit.  Patient states he does not have any diagnosis of diabetes.  Patient states he is not having any fevers, nausea/vomiting, abdominal pain, chest pain, difficulty breathing.  Patient does report he has a primary care doctor.     The history is provided by the patient.    Past Medical History:  Diagnosis Date  . Gout   . H/O exercise stress test 2012   normal  . Obstructive sleep apnea on CPAP     Patient Active Problem List   Diagnosis Date Noted  . Chest pain 01/31/2013  . OSA on CPAP 01/31/2013    History reviewed. No pertinent surgical history.      Home Medications    Prior to Admission medications   Medication Sig Start Date End Date Taking? Authorizing Provider  colchicine 0.6 MG tablet Take 0.6 mg by mouth at bedtime.    [provider]  febuxostat (ULORIC) 40 MG tablet Take 40 mg by mouth daily.    [provider]  methocarbamol (ROBAXIN) 500 MG tablet Take 1 tablet (500 mg total) by mouth 3 (three) times daily between meals as needed. 09/25/17   Tanna Furry, MD  naproxen (NAPROSYN) 500 MG tablet Take 1 tablet (500 mg total) by mouth 2 (two) times daily. 09/25/17   Tanna Furry, MD  NITROSTAT 0.4 MG SL tablet as needed. 01/15/13   [provider]  traMADol (ULTRAM) 50 MG tablet Take 1 tablet (50 mg total) by mouth every 6 (six) hours as needed. 09/25/17   Tanna Furry, MD    Family History Family History  Problem Relation Age of Onset  . Heart failure Father        mini-stroke?  Marland Kitchen Heart attack Father   . Diabetes Maternal Grandmother   . Heart Problems Maternal Grandmother   . Heart failure Maternal Grandmother   . Healthy Mother   . Healthy Brother   . Heart attack Maternal Grandfather   . Stroke Maternal Grandfather     Social History Social History   Tobacco Use  . Smoking status: Never Smoker  . Smokeless tobacco: Never Used  Substance Use Topics  . Alcohol use: Yes    Comment: about a 6 pack a year  .  Drug use: No     Allergies   Nasacort [triamcinolone]   Review of Systems Review of Systems  Constitutional: Negative for fever.  Respiratory: Negative for cough and shortness of breath.   Cardiovascular: Negative for chest pain.  Gastrointestinal: Negative for abdominal pain, nausea and vomiting.  Genitourinary: Positive for frequency. Negative for dysuria and hematuria.  Neurological: Negative for headaches.  All other systems reviewed and are negative.    Physical Exam Updated Vital Signs BP 138/82 (BP Location: Right Arm)   Pulse 86   Temp 98.2 F (36.8 C) (Oral)   Resp 20   Ht 5\' 10"  (1.778 m)   Wt 120.2 kg   SpO2 100%   BMI 38.02 kg/m   Physical Exam Vitals signs and nursing note reviewed.  Constitutional:      Appearance: Normal appearance. He is  well-developed.  HENT:     Head: Normocephalic and atraumatic.  Eyes:     General: Lids are normal.     Conjunctiva/sclera: Conjunctivae normal.     Pupils: Pupils are equal, round, and reactive to light.  Neck:     Musculoskeletal: Full passive range of motion without pain.  Cardiovascular:     Rate and Rhythm: Normal rate and regular rhythm.     Pulses: Normal pulses.     Heart sounds: Normal heart sounds. No murmur. No friction rub. No gallop.   Pulmonary:     Effort: Pulmonary effort is normal.     Breath sounds: Normal breath sounds.     Comments: Lungs clear to auscultation bilaterally.  Symmetric chest rise.  No wheezing, rales, rhonchi. Abdominal:     Palpations: Abdomen is soft. Abdomen is not rigid.     Tenderness: There is no abdominal tenderness. There is no guarding.     Comments: Abdomen is soft, non-distended, non-tender. No rigidity, No guarding. No peritoneal signs.  Musculoskeletal: Normal range of motion.  Skin:    General: Skin is warm and dry.     Capillary Refill: Capillary refill takes less than 2 seconds.  Neurological:     Mental Status: He is alert and oriented to person, place, and time.  Psychiatric:        Speech: Speech normal.      ED Treatments / Results  Labs (all labs ordered are listed, but only abnormal results are displayed) Labs Reviewed  COMPREHENSIVE METABOLIC PANEL - Abnormal; Notable for the following components:      Result Value   Glucose, Bld 202 (*)    AST 45 (*)    ALT 57 (*)    All other components within normal limits  URINALYSIS, ROUTINE W REFLEX MICROSCOPIC - Abnormal; Notable for the following components:   Glucose, UA 250 (*)    All other components within normal limits  CBG MONITORING, ED - Abnormal; Notable for the following components:   Glucose-Capillary 238 (*)    All other components within normal limits  CBC WITH DIFFERENTIAL/PLATELET    EKG None  Radiology No results found.  Procedures Procedures  (including critical care time)  Medications Ordered in ED Medications - No data to display   Initial Impression / Assessment and Plan / ED Course  I have reviewed the triage vital signs and the nursing notes.  Pertinent labs & imaging results that were available during my care of the patient were reviewed by me and considered in my medical decision making (see chart for details).        53 year old male  who presents for evaluation of increased urinary frequency and hyperglycemia.  He reports that over the last 2 days, he has had increased urinary frequency.  Denies any dysuria, hematuria.  No fevers, abdominal pain, nausea/vomiting.  Patient states he was concerned about how many times he was peeing so he checked his blood sugar with his wife's meter.  He reports that he had 10 episodes of urination yesterday.  He states that yesterday when he checked his blood sugar, his blood sugar was 400.  Throughout the day, he continued checking it and it continued to be elevated that would drop.  He states that today, it started dropping but then he ate a biscuit and it got high again so he came to the ED. patient with no diagnosis of diabetes but does report that his primary care doctor has been looking at it over the last few years.  No chest pain, shortness of breath. Patient is afebrile, non-toxic appearing, sitting comfortably on examination table. Vital signs reviewed and stable.  Will plan to check labs, UA.  UA shows glucose in urine.  No evidence of infectious etiology.  CBC without any significant leukocytosis or anemia.  CMP shows glucose of 202.  AST/ALT are slightly elevated.  Discussed results with patient.  He reports he has been told he had fatty liver before and states he has had elevations in LFTs before.  Instructed to have his primary care doctor repeat the testing.  Additionally, I discussed with patient regarding his blood glucose level as well as glucose seen in urine.  I did  encourage him to follow-up with primary care doctor as this could be indicative of new onset diabetes.  At this time, work-up is not consistent with DKA.  I did discuss with patient that here in the ED, given no history of diabetes, history of A1c's, do not feel that starting him on an antihyperglycemic at this time would be in his best interest.  I feel that this should be managed by PCP after they get an accurate A1c and FBG.  He expresses understanding. At this time, patient exhibits no emergent life-threatening condition that require further evaluation in ED or admission. Patient had ample opportunity for questions and discussion. All patient's questions were answered with full understanding. Strict return precautions discussed. Patient expresses understanding and agreement to plan.   Portions of this note were generated with Lobbyist. Dictation errors may occur despite best attempts at proofreading.   Final Clinical Impressions(s) / ED Diagnoses   Final diagnoses:  Increased urinary frequency  Hyperglycemia    ED Discharge Orders    None       Desma Mcgregor 08/18/18 1811    Quintella Reichert, MD 08/23/18 1214

## 2018-08-18 NOTE — ED Notes (Signed)
ED Provider at bedside. 

## 2018-08-18 NOTE — ED Triage Notes (Signed)
Patient states that for the last 2 days. Patient states that he has checked his sugar in the past and it has been WNL. The patient states that with his increased urination yesterday he checked his Sugar and it was over 400 - the patient reports that he was able to get it lower today prior to eating a biscuit - it is now between 250 - 300. The patient reports that he is not diabetic and only has been worried about his increase in urine since yesterday

## 2018-08-19 DIAGNOSIS — Z6838 Body mass index (BMI) 38.0-38.9, adult: Secondary | ICD-10-CM | POA: Diagnosis not present

## 2018-08-19 DIAGNOSIS — E119 Type 2 diabetes mellitus without complications: Secondary | ICD-10-CM | POA: Diagnosis not present

## 2018-09-19 DIAGNOSIS — I10 Essential (primary) hypertension: Secondary | ICD-10-CM | POA: Diagnosis not present

## 2018-09-19 DIAGNOSIS — Z719 Counseling, unspecified: Secondary | ICD-10-CM | POA: Diagnosis not present

## 2018-09-19 DIAGNOSIS — E119 Type 2 diabetes mellitus without complications: Secondary | ICD-10-CM | POA: Diagnosis not present

## 2018-09-19 DIAGNOSIS — M109 Gout, unspecified: Secondary | ICD-10-CM | POA: Diagnosis not present

## 2019-02-21 DIAGNOSIS — Z125 Encounter for screening for malignant neoplasm of prostate: Secondary | ICD-10-CM | POA: Diagnosis not present

## 2019-02-21 DIAGNOSIS — E119 Type 2 diabetes mellitus without complications: Secondary | ICD-10-CM | POA: Diagnosis not present

## 2019-02-21 DIAGNOSIS — Z0000 Encounter for general adult medical examination without abnormal findings: Secondary | ICD-10-CM | POA: Diagnosis not present

## 2019-02-26 DIAGNOSIS — Z23 Encounter for immunization: Secondary | ICD-10-CM | POA: Diagnosis not present

## 2019-02-26 DIAGNOSIS — H6123 Impacted cerumen, bilateral: Secondary | ICD-10-CM | POA: Diagnosis not present

## 2019-02-26 DIAGNOSIS — Z0000 Encounter for general adult medical examination without abnormal findings: Secondary | ICD-10-CM | POA: Diagnosis not present

## 2019-02-26 DIAGNOSIS — Z1211 Encounter for screening for malignant neoplasm of colon: Secondary | ICD-10-CM | POA: Diagnosis not present

## 2019-02-26 DIAGNOSIS — Z6837 Body mass index (BMI) 37.0-37.9, adult: Secondary | ICD-10-CM | POA: Diagnosis not present

## 2019-03-07 DIAGNOSIS — Z79899 Other long term (current) drug therapy: Secondary | ICD-10-CM | POA: Diagnosis not present

## 2019-03-07 DIAGNOSIS — R7989 Other specified abnormal findings of blood chemistry: Secondary | ICD-10-CM | POA: Diagnosis not present

## 2019-03-07 DIAGNOSIS — M1A09X0 Idiopathic chronic gout, multiple sites, without tophus (tophi): Secondary | ICD-10-CM | POA: Diagnosis not present

## 2019-03-13 DIAGNOSIS — E119 Type 2 diabetes mellitus without complications: Secondary | ICD-10-CM | POA: Diagnosis not present

## 2019-03-13 DIAGNOSIS — I10 Essential (primary) hypertension: Secondary | ICD-10-CM | POA: Diagnosis not present

## 2019-03-13 DIAGNOSIS — M109 Gout, unspecified: Secondary | ICD-10-CM | POA: Diagnosis not present

## 2019-03-17 ENCOUNTER — Encounter: Payer: Self-pay | Admitting: Gastroenterology

## 2019-04-09 ENCOUNTER — Other Ambulatory Visit: Payer: Self-pay

## 2019-04-09 ENCOUNTER — Encounter: Payer: Self-pay | Admitting: Gastroenterology

## 2019-04-09 ENCOUNTER — Ambulatory Visit (AMBULATORY_SURGERY_CENTER): Payer: Federal, State, Local not specified - PPO | Admitting: *Deleted

## 2019-04-09 VITALS — Ht 70.0 in | Wt 252.0 lb

## 2019-04-09 DIAGNOSIS — Z1211 Encounter for screening for malignant neoplasm of colon: Secondary | ICD-10-CM

## 2019-04-09 DIAGNOSIS — T7840XA Allergy, unspecified, initial encounter: Secondary | ICD-10-CM | POA: Insufficient documentation

## 2019-04-09 DIAGNOSIS — Z1159 Encounter for screening for other viral diseases: Secondary | ICD-10-CM

## 2019-04-09 MED ORDER — NA SULFATE-K SULFATE-MG SULF 17.5-3.13-1.6 GM/177ML PO SOLN: ORAL | 0 refills | Status: DC

## 2019-04-09 NOTE — Progress Notes (Signed)
Patient is here in-person for PV. Patient denies any allergies to eggs or soy. Patient denies any past surgeries.  Patient denies any oxygen use at home. Patient denies taking any diet/weight loss medications or blood thinners. Patient is not being treated for MRSA or C-diff. EMMI education assisgned to the patient for the procedure, this was explained and instructions given to patient. Suprep $15 off coupon given to pt. COVID-19 screening test is on 04/18/2019 at 840am, the pt is aware. Pt is aware NOT to take Metformin the day of the procedure!  Pt is aware that care partner will wait in the car during procedure; if they feel like they will be too hot or cold to wait in the car; they may wait in the 4 th floor lobby. Patient is aware to bring only one care partner. We want them to wear a mask (we do not have any that we can provide them), practice social distancing, and we will check their temperatures when they get here.  I did remind the patient that their care partner needs to stay in the parking lot the entire time and have a cell phone available, we will call them when the pt is ready for discharge. Patient will wear mask into building.

## 2019-04-15 DIAGNOSIS — Z23 Encounter for immunization: Secondary | ICD-10-CM | POA: Diagnosis not present

## 2019-04-18 ENCOUNTER — Other Ambulatory Visit: Payer: Self-pay | Admitting: Gastroenterology

## 2019-04-18 DIAGNOSIS — Z1159 Encounter for screening for other viral diseases: Secondary | ICD-10-CM | POA: Diagnosis not present

## 2019-04-18 LAB — SARS CORONAVIRUS 2 (TAT 6-24 HRS): SARS Coronavirus 2: NEGATIVE

## 2019-04-22 ENCOUNTER — Telehealth: Payer: Self-pay | Admitting: Gastroenterology

## 2019-04-22 DIAGNOSIS — M549 Dorsalgia, unspecified: Secondary | ICD-10-CM | POA: Diagnosis not present

## 2019-04-22 NOTE — Telephone Encounter (Signed)
Pt asked if he could add lemon-aid flavor pack to the 2- 16 oz.  bottles of water he is to drink after he drinks the suprep.  I advised him that would be fine.  Also reminded him he could use a straw to drink the pre with too.  Pt to call back if any other questions. maw

## 2019-04-23 ENCOUNTER — Other Ambulatory Visit: Payer: Self-pay

## 2019-04-23 ENCOUNTER — Encounter: Payer: Self-pay | Admitting: Gastroenterology

## 2019-04-23 ENCOUNTER — Ambulatory Visit (AMBULATORY_SURGERY_CENTER): Payer: Federal, State, Local not specified - PPO | Admitting: Gastroenterology

## 2019-04-23 VITALS — BP 123/78 | HR 68 | Temp 98.3°F | Resp 18 | Ht 70.0 in | Wt 252.0 lb

## 2019-04-23 DIAGNOSIS — K635 Polyp of colon: Secondary | ICD-10-CM

## 2019-04-23 DIAGNOSIS — D128 Benign neoplasm of rectum: Secondary | ICD-10-CM | POA: Diagnosis not present

## 2019-04-23 DIAGNOSIS — Z1211 Encounter for screening for malignant neoplasm of colon: Secondary | ICD-10-CM

## 2019-04-23 DIAGNOSIS — D125 Benign neoplasm of sigmoid colon: Secondary | ICD-10-CM

## 2019-04-23 MED ORDER — SODIUM CHLORIDE 0.9 % IV SOLN: 500.0000 mL | Freq: Once | INTRAVENOUS | Status: DC

## 2019-04-23 NOTE — Progress Notes (Signed)
Report to PACU, RN, vss, BBS= Clear.  

## 2019-04-23 NOTE — Progress Notes (Signed)
Pt's states no medical or surgical changes since previsit or office visit.  Temp taken by JB VS taken by CW 

## 2019-04-23 NOTE — Progress Notes (Signed)
Called to room to assist during endoscopic procedure.  Patient ID and intended procedure confirmed with present staff. Received instructions for my participation in the procedure from the performing physician.  

## 2019-04-23 NOTE — Patient Instructions (Addendum)
YOU HAD AN ENDOSCOPIC PROCEDURE TODAY AT [facility]:   Refer to the procedure report that was given to you for any specific questions about what was found during the examination.  If the procedure report does not answer your questions, please call your gastroenterologist to clarify.  If you requested that your care partner not be given the details of your procedure findings, then the procedure report has been included in a sealed envelope for you to review at your convenience later.  YOU SHOULD EXPECT: Some feelings of bloating in the abdomen. Passage of more gas than usual.  Walking can help get rid of the air that was put into your GI tract during the procedure and reduce the bloating. If you had a lower endoscopy (such as a colonoscopy or flexible sigmoidoscopy) you may notice spotting of blood in your stool or on the toilet paper. If you underwent a bowel prep for your procedure, you may not have a normal bowel movement for a few days.  Please Note:  You might notice some irritation and congestion in your nose or some drainage.  This is from the oxygen used during your procedure.  There is no need for concern and it should clear up in a day or so.  SYMPTOMS TO REPORT IMMEDIATELY:   Following lower endoscopy (colonoscopy or flexible sigmoidoscopy):  Excessive amounts of blood in the stool  Significant tenderness or worsening of abdominal pains  Swelling of the abdomen that is new, acute  Fever of 100F or higher   For urgent or emergent issues, a gastroenterologist can be reached at any hour by calling [phone].   DIET:  We do recommend a small meal at first, but then you may proceed to your regular diet.  Drink plenty of fluids but you should avoid alcoholic beverages for 24 hours.  ACTIVITY:  You should plan to take it easy for the rest of today and you should NOT DRIVE or use heavy machinery until tomorrow (because of the sedation medicines used during the test).     FOLLOW UP: Our staff will call the number listed on your records 48-72 hours following your procedure to check on you and address any questions or concerns that you may have regarding the information given to you following your procedure. If we do not reach you, we will leave a message.  We will attempt to reach you two times.  During this call, we will ask if you have developed any symptoms of COVID 19. If you develop any symptoms (ie: fever, flu-like symptoms, shortness of breath, cough etc.) before then, please call [phone].  If you test positive for Covid 19 in the 2 weeks post procedure, please call and report this information to [place].    If any biopsies were taken you will be contacted by phone or by letter within the next 1-3 weeks.  Please call us at [phone] if you have not heard about the biopsies in 3 weeks.    SIGNATURES/CONFIDENTIALITY: You and/or your care partner have signed paperwork which will be entered into your electronic medical record.  These signatures attest to the fact that that the information above on your After Visit Summary has been reviewed and is understood.  Full responsibility of the confidentiality of this discharge information lies with you and/or your care-partner.     Handouts were given to you on polyps and diverticulosis. Your blood sugar was 128 in the recovery room. You may resume your current  medications today. Await biopsy results. Please call if any questions or concerns.

## 2019-04-23 NOTE — Progress Notes (Signed)
No problems noted in the recovery room. maw 

## 2019-04-23 NOTE — Op Note (Addendum)
[facility] Patient Name: Steven Giles Procedure Date: 04/23/2019 7:29 AM MRN: [id number] Endoscopist: Thornton Park MD, MD Age: 53 Referring MD:  Date of Birth: 1965-10-10 Gender: Male Account #: [id number] Procedure:                Colonoscopy Indications:              Screening for colorectal malignant neoplasm, This                            is the patient's first colonoscopy                           No known family history of colon cancer or polyps Medicines:                Monitored Anesthesia Care Procedure:                Pre-Anesthesia Assessment:                           - Prior to the procedure, a History and Physical                            was performed, and patient medications and                            allergies were reviewed. The patient's tolerance of                            previous anesthesia was also reviewed. The risks                            and benefits of the procedure and the sedation                            options and risks were discussed with the patient.                            All questions were answered, and informed consent                            was obtained. Prior Anticoagulants: The patient has                            taken no previous anticoagulant or antiplatelet                            agents. ASA Grade Assessment: II - A patient with                            mild systemic disease. After reviewing the risks                            and benefits, the patient was deemed in  satisfactory condition to undergo the procedure.                           After obtaining informed consent, the colonoscope                            was passed under direct vision. Throughout the                            procedure, the patient's blood pressure, pulse, and                            oxygen saturations were monitored continuously. The                            Colonoscope was  introduced through the anus and                            advanced to the the cecum, identified by                            appendiceal orifice and ileocecal valve. A second                            forward view of the right colon was performed. The                            colonoscopy was performed with difficulty due to a                            redundant colon and a tortuous colon. Successful                            completion of the procedure was aided by applying                            abdominal pressure. The patient tolerated the                            procedure well. The quality of the bowel                            preparation was excellent. The ileocecal valve,                            appendiceal orifice, and rectum were photographed. Scope In: 8:03:54 AM Scope Out: 8:20:49 AM Scope Withdrawal Time: 0 hours 11 minutes 1 second  Total Procedure Duration: 0 hours 16 minutes 55 seconds  Findings:                 Hemorrhoids were found on perianal exam.                           A 3 mm polyp was found in the  sigmoid colon. The                            polyp was sessile. The polyp was removed with a                            cold snare. Resection and retrieval were complete.                            Estimated blood loss was minimal.                           A 4 mm polyp was found in the distal rectum. The                            polyp was sessile. The polyp was removed with a                            cold snare. Resection and retrieval were complete.                            Estimated blood loss was minimal.                           Multiple small and large-mouthed diverticula were                            found in the sigmoid colon, descending colon,                            transverse colon and ascending colon.                           The exam was otherwise without abnormality on                            direct and retroflexion  views. Complications:            No immediate complications. Estimated blood loss:                            Minimal. Estimated Blood Loss:     Estimated blood loss was minimal. Impression:               - Hemorrhoids found on perianal exam.                           - One 3 mm polyp in the sigmoid colon, removed with                            a cold snare. Resected and retrieved.                           - One 4 mm polyp in the distal rectum, removed with  a cold snare. Resected and retrieved.                           - Diverticulosis in the sigmoid colon, in the                            descending colon, in the transverse colon and in                            the ascending colon.                           - The examination was otherwise normal on direct                            and retroflexion views. Recommendation:           - Patient has a contact number available for                            emergencies. The signs and symptoms of potential                            delayed complications were discussed with the                            patient. Return to normal activities tomorrow.                            Written discharge instructions were provided to the                            patient.                           - Resume previous diet today. Follow a high fiber                            diet.                           - Continue present medications.                           - Await pathology results.                           - Repeat colonoscopy date to be determined after                            pending pathology results are reviewed for                            surveillance based on pathology results. Thornton Park MD, MD 04/23/2019 8:26:31 AM This report has been signed electronically.

## 2019-04-25 ENCOUNTER — Telehealth: Payer: Self-pay

## 2019-04-25 ENCOUNTER — Encounter: Payer: Self-pay | Admitting: Gastroenterology

## 2019-04-25 NOTE — Telephone Encounter (Signed)
  Follow up Call-  Call back number 04/23/2019  Post procedure Call Back phone  # [phone]  Permission to leave phone message Yes  Some recent data might be hidden     Patient questions:  Do you have a fever, pain , or abdominal swelling? No. Pain Score  0 *  Have you tolerated food without any problems? Yes.    Have you been able to return to your normal activities? Yes.    Do you have any questions about your discharge instructions: Diet   No. Medications  No. Follow up visit  No.  Do you have questions or concerns about your Care? No.  Actions: * If pain score is 4 or above: No action needed, pain <4.  1. Have you developed a fever since your procedure? No  2.   Have you had an respiratory symptoms (SOB or cough) since your procedure? No  3.   Have you tested positive for COVID 19 since your procedure No  4.   Have you had any family members/close contacts diagnosed with the COVID 19 since your procedure?  No   If yes to any of these questions please route to Joylene John, RN and Alphonsa Gin, RN.

## 2019-06-09 ENCOUNTER — Ambulatory Visit: Payer: Federal, State, Local not specified - PPO | Attending: Internal Medicine

## 2019-06-09 DIAGNOSIS — Z20828 Contact with and (suspected) exposure to other viral communicable diseases: Secondary | ICD-10-CM | POA: Diagnosis not present

## 2019-06-09 DIAGNOSIS — Z20822 Contact with and (suspected) exposure to covid-19: Secondary | ICD-10-CM

## 2019-06-11 LAB — NOVEL CORONAVIRUS, NAA: SARS-CoV-2, NAA: DETECTED — AB

## 2019-06-18 DIAGNOSIS — J329 Chronic sinusitis, unspecified: Secondary | ICD-10-CM | POA: Diagnosis not present

## 2019-06-18 DIAGNOSIS — R05 Cough: Secondary | ICD-10-CM | POA: Diagnosis not present

## 2019-06-18 DIAGNOSIS — U071 COVID-19: Secondary | ICD-10-CM | POA: Diagnosis not present

## 2019-06-19 ENCOUNTER — Emergency Department (HOSPITAL_BASED_OUTPATIENT_CLINIC_OR_DEPARTMENT_OTHER): Payer: Federal, State, Local not specified - PPO

## 2019-06-19 ENCOUNTER — Inpatient Hospital Stay (HOSPITAL_BASED_OUTPATIENT_CLINIC_OR_DEPARTMENT_OTHER)
Admission: EM | Admit: 2019-06-19 | Discharge: 2019-06-22 | DRG: 177 | Disposition: A | Discharge status: Home / Self Care | Payer: Federal, State, Local not specified - PPO | Attending: Internal Medicine | Admitting: Internal Medicine

## 2019-06-19 ENCOUNTER — Other Ambulatory Visit: Payer: Self-pay

## 2019-06-19 ENCOUNTER — Encounter (HOSPITAL_BASED_OUTPATIENT_CLINIC_OR_DEPARTMENT_OTHER): Payer: Self-pay | Admitting: Emergency Medicine

## 2019-06-19 DIAGNOSIS — Z888 Allergy status to other drugs, medicaments and biological substances status: Secondary | ICD-10-CM

## 2019-06-19 DIAGNOSIS — R509 Fever, unspecified: Secondary | ICD-10-CM | POA: Diagnosis not present

## 2019-06-19 DIAGNOSIS — E119 Type 2 diabetes mellitus without complications: Secondary | ICD-10-CM | POA: Diagnosis not present

## 2019-06-19 DIAGNOSIS — Z833 Family history of diabetes mellitus: Secondary | ICD-10-CM | POA: Diagnosis not present

## 2019-06-19 DIAGNOSIS — J1282 Pneumonia due to coronavirus disease 2019: Secondary | ICD-10-CM | POA: Diagnosis present

## 2019-06-19 DIAGNOSIS — G4733 Obstructive sleep apnea (adult) (pediatric): Secondary | ICD-10-CM | POA: Diagnosis present

## 2019-06-19 DIAGNOSIS — E860 Dehydration: Secondary | ICD-10-CM | POA: Diagnosis not present

## 2019-06-19 DIAGNOSIS — I10 Essential (primary) hypertension: Secondary | ICD-10-CM | POA: Diagnosis present

## 2019-06-19 DIAGNOSIS — Z823 Family history of stroke: Secondary | ICD-10-CM

## 2019-06-19 DIAGNOSIS — Z6836 Body mass index (BMI) 36.0-36.9, adult: Secondary | ICD-10-CM

## 2019-06-19 DIAGNOSIS — U071 COVID-19: Secondary | ICD-10-CM | POA: Diagnosis not present

## 2019-06-19 DIAGNOSIS — M109 Gout, unspecified: Secondary | ICD-10-CM | POA: Diagnosis not present

## 2019-06-19 DIAGNOSIS — Z7984 Long term (current) use of oral hypoglycemic drugs: Secondary | ICD-10-CM

## 2019-06-19 DIAGNOSIS — J9601 Acute respiratory failure with hypoxia: Secondary | ICD-10-CM

## 2019-06-19 DIAGNOSIS — Z8249 Family history of ischemic heart disease and other diseases of the circulatory system: Secondary | ICD-10-CM

## 2019-06-19 DIAGNOSIS — K760 Fatty (change of) liver, not elsewhere classified: Secondary | ICD-10-CM | POA: Diagnosis not present

## 2019-06-19 DIAGNOSIS — R05 Cough: Secondary | ICD-10-CM | POA: Diagnosis not present

## 2019-06-19 DIAGNOSIS — R0602 Shortness of breath: Secondary | ICD-10-CM | POA: Diagnosis not present

## 2019-06-19 DIAGNOSIS — E669 Obesity, unspecified: Secondary | ICD-10-CM | POA: Diagnosis not present

## 2019-06-19 LAB — COMPREHENSIVE METABOLIC PANEL
ALT: 41 U/L (ref 0–44)
AST: 49 U/L — ABNORMAL HIGH (ref 15–41)
Albumin: 4.1 g/dL (ref 3.5–5.0)
Alkaline Phosphatase: 59 U/L (ref 38–126)
Anion gap: 11 (ref 5–15)
BUN: 21 mg/dL — ABNORMAL HIGH (ref 6–20)
CO2: 26 mmol/L (ref 22–32)
Calcium: 8.8 mg/dL — ABNORMAL LOW (ref 8.9–10.3)
Chloride: 100 mmol/L (ref 98–111)
Creatinine, Ser: 1.21 mg/dL (ref 0.61–1.24)
GFR calc Af Amer: >60 mL/min (ref >60)
GFR calc non Af Amer: >60 mL/min (ref >60)
Glucose, Bld: 105 mg/dL — ABNORMAL HIGH (ref 70–99)
Potassium: 3.7 mmol/L (ref 3.5–5.1)
Sodium: 137 mmol/L (ref 135–145)
Total Bilirubin: 0.9 mg/dL (ref 0.3–1.2)
Total Protein: 8 g/dL (ref 6.5–8.1)

## 2019-06-19 LAB — CBC WITH DIFFERENTIAL/PLATELET
Abs Immature Granulocytes: 0.03 10*3/uL (ref 0.00–0.07)
Basophils Absolute: 0 10*3/uL (ref 0.0–0.1)
Basophils Relative: 0 %
Eosinophils Absolute: 0 10*3/uL (ref 0.0–0.5)
Eosinophils Relative: 0 %
HCT: 47.8 % (ref 39.0–52.0)
Hemoglobin: 15.9 g/dL (ref 13.0–17.0)
Immature Granulocytes: 0 %
Lymphocytes Relative: 13 %
Lymphs Abs: 1.2 10*3/uL (ref 0.7–4.0)
MCH: 30.2 pg (ref 26.0–34.0)
MCHC: 33.3 g/dL (ref 30.0–36.0)
MCV: 90.7 fL (ref 80.0–100.0)
Monocytes Absolute: 0.6 10*3/uL (ref 0.1–1.0)
Monocytes Relative: 6 %
Neutro Abs: 7.6 10*3/uL (ref 1.7–7.7)
Neutrophils Relative %: 81 %
Platelets: 171 10*3/uL (ref 150–400)
RBC: 5.27 MIL/uL (ref 4.22–5.81)
RDW: 13.6 % (ref 11.5–15.5)
WBC: 9.4 10*3/uL (ref 4.0–10.5)
nRBC: 0 % (ref 0.0–0.2)

## 2019-06-19 LAB — TRIGLYCERIDES: Triglycerides: 193 mg/dL — ABNORMAL HIGH (ref <150)

## 2019-06-19 LAB — FERRITIN: Ferritin: 1451 ng/mL — ABNORMAL HIGH (ref 24–336)

## 2019-06-19 LAB — LACTATE DEHYDROGENASE: LDH: 355 U/L — ABNORMAL HIGH (ref 98–192)

## 2019-06-19 LAB — CBG MONITORING, ED
Glucose-Capillary: 109 mg/dL — ABNORMAL HIGH (ref 70–99)
Glucose-Capillary: 114 mg/dL — ABNORMAL HIGH (ref 70–99)

## 2019-06-19 LAB — PROCALCITONIN: Procalcitonin: 21.84 ng/mL

## 2019-06-19 LAB — FIBRINOGEN: Fibrinogen: 700 mg/dL — ABNORMAL HIGH (ref 210–475)

## 2019-06-19 LAB — C-REACTIVE PROTEIN: CRP: 14.7 mg/dL — ABNORMAL HIGH (ref <1.0)

## 2019-06-19 LAB — D-DIMER, QUANTITATIVE: D-Dimer, Quant: 1.02 ug/mL-FEU — ABNORMAL HIGH (ref 0.00–0.50)

## 2019-06-19 LAB — LACTIC ACID, PLASMA: Lactic Acid, Venous: 1.7 mmol/L (ref 0.5–1.9)

## 2019-06-19 MED ORDER — SODIUM CHLORIDE 0.9 % IV BOLUS
500.0000 mL | Freq: Once | INTRAVENOUS | Status: AC
  Administered 2019-06-19: 14:00:00 500 mL via INTRAVENOUS

## 2019-06-19 MED ORDER — DEXAMETHASONE SODIUM PHOSPHATE 10 MG/ML IJ SOLN
10.0000 mg | INTRAMUSCULAR | Status: DC
  Administered 2019-06-20: 10 mg via INTRAVENOUS
  Filled 2019-06-19: qty 1

## 2019-06-19 MED ORDER — SODIUM CHLORIDE 0.9 % IV SOLN
100.0000 mg | INTRAVENOUS | Status: AC
  Administered 2019-06-19 (×2): 100 mg via INTRAVENOUS
  Filled 2019-06-19 (×2): qty 20

## 2019-06-19 MED ORDER — LACTATED RINGERS IV SOLN: INTRAVENOUS | Status: DC

## 2019-06-19 MED ORDER — ACETAMINOPHEN 325 MG PO TABS
650.0000 mg | ORAL_TABLET | Freq: Once | ORAL | Status: AC
  Administered 2019-06-19: 650 mg via ORAL
  Filled 2019-06-19: qty 2

## 2019-06-19 MED ORDER — DEXAMETHASONE SODIUM PHOSPHATE 10 MG/ML IJ SOLN
10.0000 mg | Freq: Once | INTRAMUSCULAR | Status: AC
  Administered 2019-06-19: 15:00:00 10 mg via INTRAVENOUS
  Filled 2019-06-19: qty 1

## 2019-06-19 MED ORDER — IOHEXOL 350 MG/ML SOLN
100.0000 mL | Freq: Once | INTRAVENOUS | Status: AC | PRN
  Administered 2019-06-19: 13:00:00 100 mL via INTRAVENOUS

## 2019-06-19 MED ORDER — INSULIN ASPART 100 UNIT/ML ~~LOC~~ SOLN
0.0000 [IU] | Freq: Three times a day (TID) | SUBCUTANEOUS | Status: DC
  Administered 2019-06-20: 14:00:00 3 [IU] via SUBCUTANEOUS
  Administered 2019-06-20: 08:00:00 2 [IU] via SUBCUTANEOUS
  Administered 2019-06-20 – 2019-06-21 (×2): 3 [IU] via SUBCUTANEOUS
  Administered 2019-06-21: 10:00:00 5 [IU] via SUBCUTANEOUS
  Administered 2019-06-21: 12:00:00 3 [IU] via SUBCUTANEOUS
  Administered 2019-06-22: 13:00:00 5 [IU] via SUBCUTANEOUS
  Administered 2019-06-22: 3 [IU] via SUBCUTANEOUS

## 2019-06-19 MED ORDER — SODIUM CHLORIDE 0.9 % IV SOLN
100.0000 mg | Freq: Every day | INTRAVENOUS | Status: DC
  Administered 2019-06-20 – 2019-06-22 (×3): 100 mg via INTRAVENOUS
  Filled 2019-06-19 (×3): qty 20

## 2019-06-19 MED ORDER — INSULIN ASPART 100 UNIT/ML ~~LOC~~ SOLN: 0.0000 [IU] | Freq: Every day | SUBCUTANEOUS | Status: DC

## 2019-06-19 MED ORDER — SODIUM CHLORIDE 0.9 % IV BOLUS
500.0000 mL | Freq: Once | INTRAVENOUS | Status: AC
  Administered 2019-06-19: 12:00:00 500 mL via INTRAVENOUS

## 2019-06-19 NOTE — ED Notes (Signed)
AMBULATION POX COMPLETED LABS DRAWN AND TO LAB

## 2019-06-19 NOTE — ED Provider Notes (Signed)
[facility] EMERGENCY DEPARTMENT Provider Note   CSN: [id number] Arrival date & time: 06/19/19  1003     History Chief Complaint  Patient presents with  . Covid +, Fever    Steven Giles is a 54 y.o. male history obesity, diabetes, sleep apnea, hypertension.  Patient Covid positive arrives on his 11th day of illness.  Symptom onset 06/08/2019.  Patient reports primary symptoms of fatigue, body aches and cough productive with white sputum.  Symptoms have been constant since onset and worsening.  He reports occasional shortness of breath with coughing or extended exercise.  He is followed by his primary care provider, had fever of 104 F this morning at 1 AM and was encouraged to come in by PCP.  Last Tylenol dose 5 AM this morning.  Denies headache, vision changes, neck stiffness, chest pain, hemoptysis, abdominal pain, vomiting/diarrhea, fall/injury, swelling/color change or any additional concerns.  HPI     Past Medical History:  Diagnosis Date  . Diabetes mellitus without complication ([place])   . Gout   . H/O exercise stress test 2012   normal  . Hypertension   . Obstructive sleep apnea on CPAP   . Sleep apnea     Patient Active Problem List   Diagnosis Date Noted  . COVID-19 virus infection 06/19/2019  . Allergy 04/09/2019  . Chest pain 01/31/2013  . OSA on CPAP 01/31/2013  . Gout 01/03/2011    Past Surgical History:  Procedure Laterality Date  . NO PAST SURGERIES         Family History  Problem Relation Age of Onset  . Heart failure Father        mini-stroke?  Marland Kitchen Heart attack Father   . Diabetes Maternal Grandmother   . Heart Problems Maternal Grandmother   . Heart failure Maternal Grandmother   . Healthy Mother   . Healthy Brother   . Heart attack Maternal Grandfather   . Stroke Maternal Grandfather   . Colon cancer Neg Hx   . Colon polyps Neg Hx   . Esophageal cancer Neg Hx   . Rectal cancer Neg Hx   . Stomach cancer Neg Hx     Social  History   Tobacco Use  . Smoking status: Never Smoker  . Smokeless tobacco: Never Used  Substance Use Topics  . Alcohol use: Yes    Alcohol/week: 3.0 standard drinks    Types: 3 Cans of beer per week  . Drug use: No    Home Medications Prior to Admission medications   Medication Sig Start Date End Date Taking? Authorizing Provider  febuxostat (ULORIC) 40 MG tablet Take 40 mg by mouth daily.    [provider]  lisinopril-hydrochlorothiazide (ZESTORETIC) 10-12.5 MG tablet Take 1 tablet by mouth daily. 03/13/19   [provider]  metFORMIN (GLUCOPHAGE) 500 MG tablet Take 500 mg by mouth 2 (two) times daily. 03/03/19   [provider]    Allergies    Nasacort [triamcinolone]  Review of Systems   Review of Systems Ten systems are reviewed and are negative for acute change except as noted in the HPI  Physical Exam Updated Vital Signs BP 119/81   Pulse 88   Temp (!) 102.4 F (39.1 C) (Rectal)   Resp (!) 26   Ht 5\' 10"  (1.778 m)   Wt 112.2 kg   SpO2 93%   BMI 35.48 kg/m   Physical Exam Constitutional:      General: He is not in acute  distress.    Appearance: Normal appearance. He is well-developed. He is obese. He is ill-appearing. He is not toxic-appearing or diaphoretic.  HENT:     Head: Normocephalic and atraumatic.     Right Ear: External ear normal.     Left Ear: External ear normal.     Nose: Nose normal.  Eyes:     General: Vision grossly intact. Gaze aligned appropriately.     Pupils: Pupils are equal, round, and reactive to light.  Neck:     Trachea: Trachea and phonation normal. No tracheal deviation.  Cardiovascular:     Rate and Rhythm: Normal rate and regular rhythm.     Pulses: Normal pulses.     Heart sounds: Normal heart sounds.  Pulmonary:     Effort: Pulmonary effort is normal. Tachypnea present. No accessory muscle usage or respiratory distress.     Breath sounds: Normal breath sounds and air entry.  Abdominal:      General: There is no distension.     Palpations: Abdomen is soft.     Tenderness: There is no abdominal tenderness. There is no guarding or rebound.  Musculoskeletal:        General: No tenderness. Normal range of motion.     Cervical back: Normal range of motion.     Right lower leg: No edema.     Left lower leg: No edema.  Skin:    General: Skin is warm and dry.  Neurological:     Mental Status: He is alert.     GCS: GCS eye subscore is 4. GCS verbal subscore is 5. GCS motor subscore is 6.     Comments: Speech is clear and goal oriented, follows commands Major Cranial nerves without deficit, no facial droop Moves extremities without ataxia, coordination intact  Psychiatric:        Behavior: Behavior normal.     ED Results / Procedures / Treatments   Labs (all labs ordered are listed, but only abnormal results are displayed) Labs Reviewed  COMPREHENSIVE METABOLIC PANEL - Abnormal; Notable for the following components:      Result Value   Glucose, Bld 105 (*)    BUN 21 (*)    Calcium 8.8 (*)    AST 49 (*)    All other components within normal limits  D-DIMER, QUANTITATIVE (NOT AT [facility]) - Abnormal; Notable for the following components:   D-Dimer, Quant 1.02 (*)    All other components within normal limits  LACTATE DEHYDROGENASE - Abnormal; Notable for the following components:   LDH 355 (*)    All other components within normal limits  TRIGLYCERIDES - Abnormal; Notable for the following components:   Triglycerides 193 (*)    All other components within normal limits  FIBRINOGEN - Abnormal; Notable for the following components:   Fibrinogen 700 (*)    All other components within normal limits  CULTURE, BLOOD (ROUTINE X 2)  CULTURE, BLOOD (ROUTINE X 2)  LACTIC ACID, PLASMA  CBC WITH DIFFERENTIAL/PLATELET  PROCALCITONIN  LACTIC ACID, PLASMA  FERRITIN  C-REACTIVE PROTEIN  HEMOGLOBIN A1C    EKG EKG Interpretation  Date/Time:  Thursday June 19 2019 10:50:19  EST Ventricular Rate:  84 PR Interval:    QRS Duration: 79 QT Interval:  368 QTC Calculation: 435 R Axis:   65 Text Interpretation: Sinus rhythm Borderline T wave abnormalities Confirmed by Quintella Reichert [phone]) on 06/19/2019 11:15:31 AM   Radiology CT Angio Chest PE W and/or Wo Contrast  Result Date:  06/19/2019 CLINICAL DATA:  Recent COVID-19 positive. Shortness of breath with positive D-dimer. Fever. EXAM: CT ANGIOGRAPHY CHEST WITH CONTRAST TECHNIQUE: Multidetector CT imaging of the chest was performed using the standard protocol during bolus administration of intravenous contrast. Multiplanar CT image reconstructions and MIPs were obtained to evaluate the vascular anatomy. CONTRAST:  148mL OMNIPAQUE IOHEXOL 350 MG/ML SOLN COMPARISON:  Chest radiograph June 19, 2019 FINDINGS: Cardiovascular: There is no evident pulmonary embolus. There is no thoracic aortic aneurysm or dissection. Visualized great vessels appear unremarkable. Note that the right innominate and left common carotid arteries arise as a common trunk, an anatomic variant. No pericardial effusion or pericardial thickening is evident. Mediastinum/Nodes: Visualized thyroid appears normal. There is no evident thoracic adenopathy. There is a fairly small focal hiatal hernia. Lungs/Pleura: There are multiple areas of ground-glass type opacity in each upper lobe, more severe on the left than on the right. Multifocal ground-glass opacity is noted in each lower lobe as well as in the inferior lingula. There is no consolidation or pleural effusion evident. There is bibasilar atelectasis. Upper Abdomen: There is hepatic steatosis. Spleen is enlarged measuring 16.4 cm from anterior to posterior dimension. Spleen is incompletely visualized. Visualized upper abdominal structures otherwise appear unremarkable. Musculoskeletal: There are no blastic or lytic bone lesions. No chest wall lesions are evident. Review of the MIP images confirms the above  findings. IMPRESSION: 1. No demonstrable pulmonary embolus. No thoracic aortic aneurysm or dissection. 2. Multifocal pneumonia with ground-glass type opacity appearance throughout the areas of infiltrate. Suspect atypical organism pneumonia. There is bibasilar atelectasis. 3.  No evident adenopathy. 4.  Hiatal hernia present. 5.  Splenic enlargement.  Note spleen incompletely visualized. 6.  Hepatic steatosis. Electronically Signed   By: Lowella Grip III M.D.   On: 06/19/2019 13:01   DG Chest Port 1 View  Result Date: 06/19/2019 CLINICAL DATA:  Fever. Cough and shortness of breath. COVID-19 positive EXAM: PORTABLE CHEST 1 VIEW COMPARISON:  February 13, 2018 FINDINGS: Lungs are clear. Heart is upper normal in size with pulmonary vascularity normal. No adenopathy. No bone lesions. IMPRESSION: Lungs clear.  No adenopathy.  Heart upper normal in size. Electronically Signed   By: Lowella Grip III M.D.   On: 06/19/2019 11:08    Procedures .Critical Care Performed by: Deliah Boston, PA-C Authorized by: Deliah Boston, PA-C   Critical care provider statement:    Critical care time (minutes):  40   Critical care was necessary to treat or prevent imminent or life-threatening deterioration of the following conditions:  Respiratory failure (Hypoxic respiratory failure due to COVID-19 viral infection)   Critical care was time spent personally by me on the following activities:  Discussions with consultants, evaluation of patient's response to treatment, examination of patient, ordering and performing treatments and interventions, ordering and review of laboratory studies, ordering and review of radiographic studies, pulse oximetry, re-evaluation of patient's condition, obtaining history from patient or surrogate, review of old charts and development of treatment plan with patient or surrogate   (including critical care time)  Medications Ordered in ED Medications  dexamethasone (DECADRON)  injection 10 mg (0 mg Intravenous Hold 06/19/19 1520)  lactated ringers infusion (has no administration in time range)  insulin aspart (novoLOG) injection 0-5 Units (has no administration in time range)  insulin aspart (novoLOG) injection 0-15 Units (has no administration in time range)  sodium chloride 0.9 % bolus 500 mL (0 mLs Intravenous Stopped 06/19/19 1423)  iohexol (OMNIPAQUE) 350 MG/ML injection 100 mL (  100 mLs Intravenous Contrast Given 06/19/19 1247)  sodium chloride 0.9 % bolus 500 mL (0 mLs Intravenous Stopped 06/19/19 1519)  acetaminophen (TYLENOL) tablet 650 mg (650 mg Oral Given 06/19/19 1440)  dexamethasone (DECADRON) injection 10 mg (10 mg Intravenous Given 06/19/19 1446)    ED Course  I have reviewed the triage vital signs and the nursing notes.  Pertinent labs & imaging results that were available during my care of the patient were reviewed by me and considered in my medical decision making (see chart for details).  Clinical Course as of Jun 19 1523  Thu Jun 19, 2019  1511 Dr. Candiss Norse   [BM]    Clinical Course User Index [BM] Deliah Boston, PA-C   MDM Rules/Calculators/A&P                     Lactic 1.7 Fibrinogen 700 LDH 355  Triglycerides 193 Procalcitonin within normal limits D-dimer 1.02 CMP nonacute CBC within normal CXR:  IMPRESSION:  Lungs clear. No adenopathy. Heart upper normal in size.   CT Angio PE Study:  IMPRESSION:  1. No demonstrable pulmonary embolus. No thoracic aortic aneurysm or  dissection.    2. Multifocal pneumonia with ground-glass type opacity appearance  throughout the areas of infiltrate. Suspect atypical organism  pneumonia. There is bibasilar atelectasis.    3. No evident adenopathy.    4. Hiatal hernia present.    5. Splenic enlargement. Note spleen incompletely visualized.    6. Hepatic steatosis.   EKG: Sinus rhythm Borderline T wave abnormalities Confirmed by Quintella Reichert [phone]) on 06/19/2019 11:15:31  AM --- Patient reevaluated uncomfortable appearing no acute distress.  Reports shortness of breath has increased since arrival, SPO2 at rest of 92% on room air.  I observed patient ambulating, SPO2 decreased to 88% on room air at end of ambulation.  He was placed on 2 L nasal cannula supplemental oxygen.  Discussed need for admission with patient and he is agreeable.  He denies any chest pain, I have low suspicion for ACS, dissection or other acute cardiopulmonary etiology of symptoms today.  Suspect patient's hypoxia shortness of breath and cough secondary to known COVID-19 viral infection.  Doubt bacterial etiology at this time will consult hospitalist for admission.  Remdesivir and Decadron ordered.  Case discussed with Dr. Ralene Bathe who agrees with plan of care. - Discussed case with Dr. Candiss Norse from hospitalist service who is accepted patient for admission.  NICODEMUS SALMI was evaluated in Emergency Department on 06/19/2019 for the symptoms described in the history of present illness. He was evaluated in the context of the global COVID-19 pandemic, which necessitated consideration that the patient might be at risk for infection with the SARS-CoV-2 virus that causes COVID-19. Institutional protocols and algorithms that pertain to the evaluation of patients at risk for COVID-19 are in a state of rapid change based on information released by regulatory bodies including the CDC and federal and state organizations. These policies and algorithms were followed during the patient's care in the ED.  Note: Portions of this report may have been transcribed using voice recognition software. Every effort was made to ensure accuracy; however, inadvertent computerized transcription errors may still be present. Final Clinical Impression(s) / ED Diagnoses Final diagnoses:  COVID-19 virus infection  Acute respiratory failure with hypoxia [facility])    Rx / DC Orders ED Discharge Orders    None       Deliah Boston,  PA-C 06/19/19 1525  Quintella Reichert, MD 06/20/19 [phone]

## 2019-06-19 NOTE — ED Notes (Addendum)
Ambulated Patient ambulated from triage to room 5 with pulse-ox on r/a.  SpO2 92-98, HR 88-96, RR 18-20, no DOE noted, denies increased WOB, endorses fatigue.  Slow steady gait.

## 2019-06-19 NOTE — ED Notes (Signed)
[facility] AND CONTACT ISOLATION INITIATED

## 2019-06-19 NOTE — ED Triage Notes (Signed)
Covid Pos 12/28.  Has had no appetite.  Spiked 104 temp at home at 1am.  Sent to ED by pmd to r/o pneumonia and eval for possible dehydration.

## 2019-06-19 NOTE — Plan of Care (Signed)
Return home

## 2019-06-19 NOTE — Progress Notes (Signed)
Steven Giles, is a 54 y.o. male, DOB - [birth year], [id number]   Morbidly obese gentleman with history of diabetes mellitus, gout who was diagnosed with COVID-19 infection several days ago in the outpatient setting now presents with some exertional shortness of breath, low-grade fevers and fatigue.  Diagnosed with COVID-19 pneumonia with mild hypoxia requiring 2 L nasal cannula oxygen.  Started on gentle IV fluids, IV Steroid and Decadron accepted to MedSurg bed.  Vitals:   06/19/19 1311 06/19/19 1330 06/19/19 1400 06/19/19 1436  BP: 117/78 114/75 119/81   Pulse: 87 90 88   Resp: (!) 29 (!) 29 (!) 26   Temp:    (!) 102.4 F (39.1 C)  TempSrc:    Rectal  SpO2: 94% 93% 93%   Weight:      Height:            Data Review   Micro Results No results found for this or any previous visit (from the past 240 hour(s)).  Radiology Reports CT Angio Chest PE W and/or Wo Contrast  Result Date: 06/19/2019 CLINICAL DATA:  Recent COVID-19 positive. Shortness of breath with positive D-dimer. Fever. EXAM: CT ANGIOGRAPHY CHEST WITH CONTRAST TECHNIQUE: Multidetector CT imaging of the chest was performed using the standard protocol during bolus administration of intravenous contrast. Multiplanar CT image reconstructions and MIPs were obtained to evaluate the vascular anatomy. CONTRAST:  144mL OMNIPAQUE IOHEXOL 350 MG/ML SOLN COMPARISON:  Chest radiograph June 19, 2019 FINDINGS: Cardiovascular: There is no evident pulmonary embolus. There is no thoracic aortic aneurysm or dissection. Visualized great vessels appear unremarkable. Note that the right innominate and left common carotid arteries arise as a common trunk, an anatomic variant. No pericardial effusion or pericardial thickening is evident. Mediastinum/Nodes: Visualized thyroid appears normal. There is no evident thoracic adenopathy. There is a fairly small focal  hiatal hernia. Lungs/Pleura: There are multiple areas of ground-glass type opacity in each upper lobe, more severe on the left than on the right. Multifocal ground-glass opacity is noted in each lower lobe as well as in the inferior lingula. There is no consolidation or pleural effusion evident. There is bibasilar atelectasis. Upper Abdomen: There is hepatic steatosis. Spleen is enlarged measuring 16.4 cm from anterior to posterior dimension. Spleen is incompletely visualized. Visualized upper abdominal structures otherwise appear unremarkable. Musculoskeletal: There are no blastic or lytic bone lesions. No chest wall lesions are evident. Review of the MIP images confirms the above findings. IMPRESSION: 1. No demonstrable pulmonary embolus. No thoracic aortic aneurysm or dissection. 2. Multifocal pneumonia with ground-glass type opacity appearance throughout the areas of infiltrate. Suspect atypical organism pneumonia. There is bibasilar atelectasis. 3.  No evident adenopathy. 4.  Hiatal hernia present. 5.  Splenic enlargement.  Note spleen incompletely visualized. 6.  Hepatic steatosis. Electronically Signed   By: Lowella Grip III M.D.   On: 06/19/2019 13:01   DG Chest Port 1 View  Result Date: 06/19/2019 CLINICAL DATA:  Fever. Cough and shortness of breath. COVID-19 positive EXAM: PORTABLE CHEST 1 VIEW COMPARISON:  February 13, 2018 FINDINGS: Lungs are clear. Heart is upper normal in size with pulmonary vascularity normal. No adenopathy. No bone lesions. IMPRESSION: Lungs clear.  No adenopathy.  Heart upper normal in size. Electronically Signed   By: Lowella Grip III M.D.   On: 06/19/2019 11:08    CBC Recent Labs  Lab 06/19/19 1028  WBC 9.4  HGB 15.9  HCT 47.8  PLT 171  MCV 90.7  MCH 30.2  MCHC  33.3  RDW 13.6  LYMPHSABS 1.2  MONOABS 0.6  EOSABS 0.0  BASOSABS 0.0    Chemistries  Recent Labs  Lab 06/19/19 1028  NA 137  K 3.7  CL 100  CO2 26  GLUCOSE 105*  BUN 21*    CREATININE 1.21  CALCIUM 8.8*  AST 49*  ALT 41  ALKPHOS 59  BILITOT 0.9   ------------------------------------------------------------------------------------------------------------------ estimated creatinine clearance is 90.6 mL/min (by C-G formula based on SCr of 1.21 mg/dL). ------------------------------------------------------------------------------------------------------------------ No results for input(s): HGBA1C in the last 72 hours. ------------------------------------------------------------------------------------------------------------------ Recent Labs    06/19/19 1028  TRIG 193*   ------------------------------------------------------------------------------------------------------------------ No results for input(s): TSH, T4TOTAL, T3FREE, THYROIDAB in the last 72 hours.  Invalid input(s): FREET3 ------------------------------------------------------------------------------------------------------------------ No results for input(s): VITAMINB12, FOLATE, FERRITIN, TIBC, IRON, RETICCTPCT in the last 72 hours.  Coagulation profile No results for input(s): INR, PROTIME in the last 168 hours.  Recent Labs    06/19/19 1028  DDIMER 1.02*    Cardiac Enzymes No results for input(s): CKMB, TROPONINI, MYOGLOBIN in the last 168 hours.  Invalid input(s): CK ------------------------------------------------------------------------------------------------------------------ Invalid input(s): POCBNP

## 2019-06-20 DIAGNOSIS — J9601 Acute respiratory failure with hypoxia: Secondary | ICD-10-CM | POA: Diagnosis present

## 2019-06-20 DIAGNOSIS — I10 Essential (primary) hypertension: Secondary | ICD-10-CM | POA: Diagnosis not present

## 2019-06-20 DIAGNOSIS — U071 COVID-19: Secondary | ICD-10-CM | POA: Diagnosis not present

## 2019-06-20 DIAGNOSIS — E119 Type 2 diabetes mellitus without complications: Secondary | ICD-10-CM | POA: Diagnosis not present

## 2019-06-20 LAB — CBC WITH DIFFERENTIAL/PLATELET
Abs Immature Granulocytes: 0.06 10*3/uL (ref 0.00–0.07)
Basophils Absolute: 0 10*3/uL (ref 0.0–0.1)
Basophils Relative: 0 %
Eosinophils Absolute: 0 10*3/uL (ref 0.0–0.5)
Eosinophils Relative: 0 %
HCT: 41.7 % (ref 39.0–52.0)
Hemoglobin: 13.8 g/dL (ref 13.0–17.0)
Immature Granulocytes: 1 %
Lymphocytes Relative: 13 %
Lymphs Abs: 1.1 10*3/uL (ref 0.7–4.0)
MCH: 30 pg (ref 26.0–34.0)
MCHC: 33.1 g/dL (ref 30.0–36.0)
MCV: 90.7 fL (ref 80.0–100.0)
Monocytes Absolute: 0.5 10*3/uL (ref 0.1–1.0)
Monocytes Relative: 6 %
Neutro Abs: 6.8 10*3/uL (ref 1.7–7.7)
Neutrophils Relative %: 80 %
Platelets: 201 10*3/uL (ref 150–400)
RBC: 4.6 MIL/uL (ref 4.22–5.81)
RDW: 13.6 % (ref 11.5–15.5)
WBC: 8.5 10*3/uL (ref 4.0–10.5)
nRBC: 0 % (ref 0.0–0.2)

## 2019-06-20 LAB — COMPREHENSIVE METABOLIC PANEL
ALT: 38 U/L (ref 0–44)
AST: 43 U/L — ABNORMAL HIGH (ref 15–41)
Albumin: 3.5 g/dL (ref 3.5–5.0)
Alkaline Phosphatase: 57 U/L (ref 38–126)
Anion gap: 13 (ref 5–15)
BUN: 21 mg/dL — ABNORMAL HIGH (ref 6–20)
CO2: 23 mmol/L (ref 22–32)
Calcium: 8.7 mg/dL — ABNORMAL LOW (ref 8.9–10.3)
Chloride: 101 mmol/L (ref 98–111)
Creatinine, Ser: 1.06 mg/dL (ref 0.61–1.24)
GFR calc Af Amer: >60 mL/min (ref >60)
GFR calc non Af Amer: >60 mL/min (ref >60)
Glucose, Bld: 140 mg/dL — ABNORMAL HIGH (ref 70–99)
Potassium: 3.7 mmol/L (ref 3.5–5.1)
Sodium: 137 mmol/L (ref 135–145)
Total Bilirubin: 0.7 mg/dL (ref 0.3–1.2)
Total Protein: 7.3 g/dL (ref 6.5–8.1)

## 2019-06-20 LAB — GLUCOSE, CAPILLARY
Glucose-Capillary: 126 mg/dL — ABNORMAL HIGH (ref 70–99)
Glucose-Capillary: 193 mg/dL — ABNORMAL HIGH (ref 70–99)
Glucose-Capillary: 169 mg/dL — ABNORMAL HIGH (ref 70–99)
Glucose-Capillary: 171 mg/dL — ABNORMAL HIGH (ref 70–99)

## 2019-06-20 LAB — HEMOGLOBIN A1C
Hgb A1c MFr Bld: 6.9 % — ABNORMAL HIGH (ref 4.8–5.6)
Hgb A1c MFr Bld: 7.1 % — ABNORMAL HIGH (ref 4.8–5.6)
Mean Plasma Glucose: 151.33 mg/dL
Mean Plasma Glucose: 157.07 mg/dL

## 2019-06-20 LAB — D-DIMER, QUANTITATIVE: D-Dimer, Quant: 0.87 ug/mL-FEU — ABNORMAL HIGH (ref 0.00–0.50)

## 2019-06-20 LAB — BRAIN NATRIURETIC PEPTIDE: B Natriuretic Peptide: 15.8 pg/mL (ref 0.0–100.0)

## 2019-06-20 LAB — FERRITIN: Ferritin: 1212 ng/mL — ABNORMAL HIGH (ref 24–336)

## 2019-06-20 LAB — ABO/RH: ABO/RH(D): B POS

## 2019-06-20 LAB — C-REACTIVE PROTEIN: CRP: 15.8 mg/dL — ABNORMAL HIGH (ref <1.0)

## 2019-06-20 LAB — HIV ANTIBODY (ROUTINE TESTING W REFLEX): HIV Screen 4th Generation wRfx: NONREACTIVE

## 2019-06-20 LAB — TYPE AND SCREEN
ABO/RH(D): B POS
Antibody Screen: NEGATIVE

## 2019-06-20 LAB — PROCALCITONIN: Procalcitonin: 14.25 ng/mL

## 2019-06-20 MED ORDER — SODIUM CHLORIDE 0.9 % IV SOLN
1.0000 g | INTRAVENOUS | Status: DC
  Administered 2019-06-20 – 2019-06-22 (×3): 1 g via INTRAVENOUS
  Filled 2019-06-20 (×2): qty 1
  Filled 2019-06-20: qty 10

## 2019-06-20 MED ORDER — FAMOTIDINE 20 MG PO TABS
20.0000 mg | ORAL_TABLET | Freq: Every day | ORAL | Status: DC
  Administered 2019-06-20 – 2019-06-22 (×3): 20 mg via ORAL
  Filled 2019-06-20 (×3): qty 1

## 2019-06-20 MED ORDER — HYDROCHLOROTHIAZIDE 12.5 MG PO CAPS
12.5000 mg | ORAL_CAPSULE | Freq: Every day | ORAL | Status: DC
  Administered 2019-06-20 – 2019-06-22 (×3): 12.5 mg via ORAL
  Filled 2019-06-20 (×3): qty 1

## 2019-06-20 MED ORDER — LISINOPRIL-HYDROCHLOROTHIAZIDE 10-12.5 MG PO TABS: 1.0000 | ORAL_TABLET | Freq: Every day | ORAL | Status: DC

## 2019-06-20 MED ORDER — LISINOPRIL 10 MG PO TABS
10.0000 mg | ORAL_TABLET | Freq: Every day | ORAL | Status: DC
  Administered 2019-06-20 – 2019-06-22 (×3): 10 mg via ORAL
  Filled 2019-06-20 (×3): qty 1

## 2019-06-20 MED ORDER — ZINC SULFATE 220 (50 ZN) MG PO CAPS
220.0000 mg | ORAL_CAPSULE | Freq: Every day | ORAL | Status: DC
  Administered 2019-06-20 – 2019-06-22 (×3): 220 mg via ORAL
  Filled 2019-06-20 (×3): qty 1

## 2019-06-20 MED ORDER — GUAIFENESIN-DM 100-10 MG/5ML PO SYRP: 10.0000 mL | ORAL_SOLUTION | ORAL | Status: DC | PRN

## 2019-06-20 MED ORDER — AZITHROMYCIN 500 MG PO TABS
500.0000 mg | ORAL_TABLET | Freq: Every day | ORAL | Status: DC
  Administered 2019-06-20 – 2019-06-22 (×3): 500 mg via ORAL
  Filled 2019-06-20 (×2): qty 1
  Filled 2019-06-20: qty 2

## 2019-06-20 MED ORDER — HYDROCOD POLST-CPM POLST ER 10-8 MG/5ML PO SUER: 5.0000 mL | Freq: Two times a day (BID) | ORAL | Status: DC | PRN

## 2019-06-20 MED ORDER — ACETAMINOPHEN 325 MG PO TABS: 650.0000 mg | ORAL_TABLET | Freq: Four times a day (QID) | ORAL | Status: DC | PRN

## 2019-06-20 MED ORDER — FEBUXOSTAT 40 MG PO TABS
40.0000 mg | ORAL_TABLET | Freq: Every day | ORAL | Status: DC
  Administered 2019-06-20 – 2019-06-22 (×3): 40 mg via ORAL
  Filled 2019-06-20 (×3): qty 1

## 2019-06-20 MED ORDER — DEXAMETHASONE SODIUM PHOSPHATE 10 MG/ML IJ SOLN
6.0000 mg | Freq: Two times a day (BID) | INTRAMUSCULAR | Status: DC
  Administered 2019-06-20 – 2019-06-22 (×4): 6 mg via INTRAVENOUS
  Filled 2019-06-20 (×4): qty 1

## 2019-06-20 MED ORDER — ASCORBIC ACID 500 MG PO TABS
500.0000 mg | ORAL_TABLET | Freq: Every day | ORAL | Status: DC
  Administered 2019-06-20 – 2019-06-22 (×3): 500 mg via ORAL
  Filled 2019-06-20 (×3): qty 1

## 2019-06-20 MED ORDER — IPRATROPIUM-ALBUTEROL 20-100 MCG/ACT IN AERS
1.0000 | INHALATION_SPRAY | Freq: Four times a day (QID) | RESPIRATORY_TRACT | Status: DC
  Administered 2019-06-20 – 2019-06-22 (×11): 1 via RESPIRATORY_TRACT
  Filled 2019-06-20: qty 4

## 2019-06-20 MED ORDER — ENOXAPARIN SODIUM 60 MG/0.6ML ~~LOC~~ SOLN
55.0000 mg | SUBCUTANEOUS | Status: DC
  Administered 2019-06-20 – 2019-06-22 (×3): 55 mg via SUBCUTANEOUS
  Filled 2019-06-20 (×4): qty 0.6

## 2019-06-20 MED ORDER — DOCUSATE SODIUM 100 MG PO CAPS
100.0000 mg | ORAL_CAPSULE | Freq: Every day | ORAL | Status: DC
  Administered 2019-06-20 – 2019-06-22 (×3): 100 mg via ORAL
  Filled 2019-06-20 (×3): qty 1

## 2019-06-20 NOTE — Progress Notes (Signed)
PROGRESS NOTE  Steven ROUGHT [id number] DOB: 04-Apr-1966 DOA: 06/19/2019  PCP: Fanny Bien, MD  Brief History/Interval Summary:  54 y.o. male, with gout who presented as a transfer from [facility] with progressive shortness of breath, fatigue, myalgias.  He was doing well until 06/08/2019 when the symptoms began.  He tested positive for SARS-CoV-2 on 12/28.  He was hospitalized for further management.  Reason for Visit: Acute respiratory failure with hypoxia.  Pneumonia due to COVID-19  Consultants: None  Procedures: None  Antibiotics: Anti-infectives (From admission, onward)   Start     Dose/Rate Route Frequency Ordered Stop   06/20/19 1000  remdesivir 100 mg in sodium chloride 0.9 % 100 mL IVPB     100 mg 200 mL/hr over 30 Minutes Intravenous Daily 06/19/19 1545 06/24/19 0959   06/19/19 1600  remdesivir 100 mg in sodium chloride 0.9 % 100 mL IVPB     100 mg 200 mL/hr over 30 Minutes Intravenous Every hour 06/19/19 1545 06/19/19 1743      Subjective/Interval History: Patient states that he continues to have some difficulty breathing.  But he is feeling slightly better compared to yesterday.  Continues to have a cough which is dry for the most part.  Denies any chest pain.  No nausea or vomiting.   Assessment/Plan:  Acute Hypoxic Resp. Failure/Pneumonia due to COVID-19   Recent Labs  Lab 06/19/19 1028 06/19/19 1055 06/20/19 0437  DDIMER 1.02*  --  0.87*  FERRITIN  --  1,451* 1,212*  CRP  --  14.7* 15.8*  ALT 41  --  38  PROCALCITON 21.84  --  14.25    Objective findings: Fever: Febrile yesterday to 102.4.  Afebrile since then. Oxygen requirements: 2 L oxygen.  Saturating in the early 90s.  COVID 19 Therapeutics: Antibacterials: Initiate ceftriaxone and azithromycin due to elevated procalcitonin.  We will plan a 5-day course. Remdesivir: Day 2 today Steroids: On dexamethasone 10 mg daily.  Change to 6 mg twice a day. Diuretics: Noted to be on  hydrochlorothiazide Actemra: Not given Convalescent Plasma: Not given Vitamin C and Zinc: Continue PUD Prophylaxis: Initiate Pepcid DVT Prophylaxis:  Lovenox   Patient seems to be stable from a respiratory standpoint.  CT angiogram did not show any PE.  Multifocal pneumonia was seen.  Continue remdesivir.  Change steroid dose.  Add antibacterials due to elevated procalcitonin.  Inflammatory markers to be trended.  CRP still elevated.  D-dimer 0.87.  Continue incentive spirometry, mobilization, prone positioning as tolerated.  Out of bed to chair.  Diabetes mellitus type 2 without any complications CBGs reasonably well controlled.  Anticipate some hyperglycemia due to steroids.  HbA1c 7.1.  Continue SSI.  Holding Metformin.  Essential hypertension Blood pressure reasonably well controlled.  Lisinopril HCTZ being continued.  History of gout Continue Uloric.  Obesity Estimated body mass index is 36.77 kg/m as calculated from the following:   Height as of this encounter: 5' 9.5" (1.765 m).   Weight as of this encounter: 114.6 kg.  Incidental findings on CT scan Noted to have splenic enlargement and hepatic steatosis.  Follow-up by PCP.  DVT Prophylaxis: Lovenox Code Status: Full code Family Communication: Discussed with the patient.  Will discuss with wife later today Disposition Plan:  Hopefully will be able to return home when improved.   Medications:  Scheduled:  vitamin C  500 mg Oral Daily   dexamethasone (DECADRON) injection  10 mg Intravenous Q24H   docusate sodium  100 mg Oral  Daily   enoxaparin (LOVENOX) injection  55 mg Subcutaneous Q24H   febuxostat  40 mg Oral Daily   lisinopril  10 mg Oral Daily   And   hydrochlorothiazide  12.5 mg Oral Daily   insulin aspart  0-15 Units Subcutaneous TID WC   insulin aspart  0-5 Units Subcutaneous QHS   Ipratropium-Albuterol  1 puff Inhalation Q6H   zinc sulfate  220 mg Oral Daily   Continuous:  lactated ringers  75 mL/hr at 06/20/19 0543   remdesivir 100 mg in NS 100 mL 100 mg (06/20/19 1050)   [id number], chlorpheniramine-HYDROcodone, guaiFENesin-dextromethorphan   Objective:  Vital Signs  Vitals:   06/19/19 2214 06/20/19 0736 06/20/19 1000 06/20/19 1058  BP:  109/71    Pulse:  81    Resp: 19 (!) 21    Temp: 98.3 F (36.8 C) 97.7 F (36.5 C)    TempSrc: Oral Oral    SpO2:  91% 93% 97%  Weight: 114.6 kg     Height: 5' 9.5" (1.765 m)       Intake/Output Summary (Last 24 hours) at 06/20/2019 1130 Last data filed at 06/20/2019 1000 Gross per 24 hour  Intake 880.28 ml  Output --  Net 880.28 ml   Filed Weights   06/19/19 1026 06/19/19 2214  Weight: 112.2 kg 114.6 kg    General appearance: Awake alert.  In no distress Resp: Tachypneic at rest.  No use of accessory muscles.  Coarse breath sounds with crackles at the bases.  No wheezing or rhonchi. Cardio: S1-S2 is normal regular.  No S3-S4.  No rubs murmurs or bruit GI: Abdomen is soft.  Nontender nondistended.  Bowel sounds are present normal.  No masses organomegaly Extremities: No edema.  Full range of motion of lower extremities. Neurologic: Alert and oriented x3.  No focal neurological deficits.    Lab Results:  Data Reviewed: I have personally reviewed following labs and imaging studies  CBC: Recent Labs  Lab 06/19/19 1028 06/20/19 0437  WBC 9.4 8.5  NEUTROABS 7.6 6.8  HGB 15.9 13.8  HCT 47.8 41.7  MCV 90.7 90.7  PLT 171 [id number]    Basic Metabolic Panel: Recent Labs  Lab 06/19/19 1028 06/20/19 0437  NA 137 137  K 3.7 3.7  CL 100 101  CO2 26 23  GLUCOSE 105* 140*  BUN 21* 21*  CREATININE 1.21 1.06  CALCIUM 8.8* 8.7*    GFR: Estimated Creatinine Clearance: 103.8 mL/min (by C-G formula based on SCr of 1.06 mg/dL).  Liver Function Tests: Recent Labs  Lab 06/19/19 1028 06/20/19 0437  AST 49* 43*  ALT 41 38  ALKPHOS 59 57  BILITOT 0.9 0.7  PROT 8.0 7.3  ALBUMIN 4.1 3.5    HbA1C: Recent  Labs    06/20/19 0437  HGBA1C 7.1*    CBG: Recent Labs  Lab 06/19/19 1613 06/19/19 2113 06/20/19 0721  GLUCAP 109* 114* 126*    Lipid Profile: Recent Labs    06/19/19 1028  TRIG 193*     Anemia Panel: Recent Labs    06/19/19 1055 06/20/19 0437  FERRITIN 1,451* 1,212*    Recent Results (from the past 240 hour(s))  Blood Culture (routine x 2)     Status: None (Preliminary result)   Collection Time: 06/19/19 10:55 AM   Specimen: BLOOD  Result Value Ref Range Status   Specimen Description   Final    BLOOD LEFT ANTECUBITAL Performed at [facility], [address] [place], [place] [address]    Special Requests   Final    BOTTLES DRAWN AEROBIC AND ANAEROBIC Blood Culture adequate volume Performed at [facility], [address] [place], [place] [address]    Culture   Final    NO GROWTH < 24 HOURS Performed at [facility] [address] [place], [place] [address]    Report Status PENDING  Incomplete      Radiology Studies: CT Angio Chest PE W and/or Wo Contrast  Result Date: 06/19/2019 CLINICAL DATA:  Recent COVID-19 positive. Shortness of breath with positive D-dimer. Fever. EXAM: CT ANGIOGRAPHY CHEST WITH CONTRAST TECHNIQUE: Multidetector CT imaging of the chest was performed using the standard protocol during bolus administration of intravenous contrast. Multiplanar CT image reconstructions and MIPs were obtained to evaluate the vascular anatomy. CONTRAST:  178mL OMNIPAQUE IOHEXOL 350 MG/ML SOLN COMPARISON:  Chest radiograph June 19, 2019 FINDINGS: Cardiovascular: There is no evident pulmonary embolus. There is no thoracic aortic aneurysm or dissection. Visualized great vessels appear unremarkable. Note that the right innominate and left common carotid arteries arise as a common trunk, an anatomic variant. No pericardial effusion or pericardial thickening is evident. Mediastinum/Nodes: Visualized thyroid appears normal. There is no  evident thoracic adenopathy. There is a fairly small focal hiatal hernia. Lungs/Pleura: There are multiple areas of ground-glass type opacity in each upper lobe, more severe on the left than on the right. Multifocal ground-glass opacity is noted in each lower lobe as well as in the inferior lingula. There is no consolidation or pleural effusion evident. There is bibasilar atelectasis. Upper Abdomen: There is hepatic steatosis. Spleen is enlarged measuring 16.4 cm from anterior to posterior dimension. Spleen is incompletely visualized. Visualized upper abdominal structures otherwise appear unremarkable. Musculoskeletal: There are no blastic or lytic bone lesions. No chest wall lesions are evident. Review of the MIP images confirms the above findings. IMPRESSION: 1. No demonstrable pulmonary embolus. No thoracic aortic aneurysm or dissection. 2. Multifocal pneumonia with ground-glass type opacity appearance throughout the areas of infiltrate. Suspect atypical organism pneumonia. There is bibasilar atelectasis. 3.  No evident adenopathy. 4.  Hiatal hernia present. 5.  Splenic enlargement.  Note spleen incompletely visualized. 6.  Hepatic steatosis. Electronically Signed   By: Lowella Grip III M.D.   On: 06/19/2019 13:01   DG Chest Port 1 View  Result Date: 06/19/2019 CLINICAL DATA:  Fever. Cough and shortness of breath. COVID-19 positive EXAM: PORTABLE CHEST 1 VIEW COMPARISON:  February 13, 2018 FINDINGS: Lungs are clear. Heart is upper normal in size with pulmonary vascularity normal. No adenopathy. No bone lesions. IMPRESSION: Lungs clear.  No adenopathy.  Heart upper normal in size. Electronically Signed   By: Lowella Grip III M.D.   On: 06/19/2019 11:08       LOS: 1 day   [facility] Pager on [email or web address]  06/20/2019, 11:30 AM

## 2019-06-20 NOTE — H&P (Signed)
TRH H&P   Patient Demographics:    Steven Giles, is a 54 y.o. male  MRN: [id number]   DOB - 1965/09/09  Admit Date - 06/19/2019  Outpatient Primary MD for the patient is Fanny Bien, MD  Patient coming from: [facility]  Chief Complaint  Patient presents with  . Covid +, Fever      HPI:    Steven Giles  is a 54 y.o. male, with gout who presents as a transfer from [facility] with progressive shortness of breath, fatigue, myalgias.  He was doing well until 06/08/2019 when the symptoms began.  He tested positive for SARS-CoV-2 on 12/28, now presents with worsening shortness of breath.  He went to his PCPs office, was found to be febrile with a temperature of 104 F, he was referred to the emergency room.  He denied any nausea, vomiting, diarrhea, constipation stiffness, photophobia.    Review of systems:  Review of Systems:  Constitutional: see HPI HEENT: negative for earaches, epistaxis, or sore throat Respiratory: see HPI Cardiovascular: negative for chest pain, palpitations, or syncope GU: negative for dysuria, urinary frequency, urinary urgency, hematuria Gastrointestinal: negative for abdominal pain, constipation, diarrhea, nausea or vomiting Musculoskeletal: negative for arthralgias, back pain or myalgias Neurological: negative for dizziness, headaches or weakness Behavioral/Psych: negative for suicidal or homicidal ideation Skin:negative for rash Heme: negative for bruises Endo: negative for hair loss, weight gain/loss  With Past History of the following :   Past Medical History:  Diagnosis Date  . Diabetes mellitus without complication ([place])   . Gout   . H/O exercise stress test 2012   normal  .  Hypertension   . Obstructive sleep apnea on CPAP   . Sleep apnea       Past Surgical History:  Procedure Laterality Date  . NO PAST SURGERIES       Social History:     Social History   Tobacco Use  . Smoking status: Never Smoker  . Smokeless tobacco: Never Used  Substance Use Topics  . Alcohol use: Yes    Alcohol/week: 3.0 standard drinks    Types: 3 Cans of beer per week     Family History :     Family History  Problem Relation Age of Onset  . Heart failure Father        mini-stroke?  Marland Kitchen  Heart attack Father   . Diabetes Maternal Grandmother   . Heart Problems Maternal Grandmother   . Heart failure Maternal Grandmother   . Healthy Mother   . Healthy Brother   . Heart attack Maternal Grandfather   . Stroke Maternal Grandfather   . Colon cancer Neg Hx   . Colon polyps Neg Hx   . Esophageal cancer Neg Hx   . Rectal cancer Neg Hx   . Stomach cancer Neg Hx     Home Medications:   Prior to Admission medications   Medication Sig Start Date End Date Taking? Authorizing Provider  febuxostat (ULORIC) 40 MG tablet Take 40 mg by mouth daily.   Yes [provider]  lisinopril-hydrochlorothiazide (ZESTORETIC) 10-12.5 MG tablet Take 1 tablet by mouth daily. 03/13/19  Yes [provider]  metFORMIN (GLUCOPHAGE) 500 MG tablet Take 500 mg by mouth 2 (two) times daily. 03/03/19  Yes [provider]     Allergies:     Allergies  Allergen Reactions  . Nasacort [Triamcinolone] Hives    Nasacort      Physical Exam:   Vitals  Blood pressure 120/86, pulse 97, temperature 98.3 F (36.8 C), temperature source Oral, resp. rate 19, height 5' 9.5" (1.765 m), weight 114.6 kg, SpO2 96 %.  Physical Exam   Constitutional - resting comfortably, no acute distress Eyes - pupils equal round and reactive to light and accomodation, extra ocular movements intact Nose - no gross deformity or drainage Mouth - no oral lesions noted Throat - no swelling or  erythema Neck - supple, no JVD   CV - (+)S1S2, no murmurs  Resp - CTA bilaterally, no wheezing or crackles,  GI - (+)BS, soft, non-tender, non-distended Extrem - no clubbing, cyanosis, or peripheral edema  Skin - no rashes or wounds Neuro - alert, aware, oriented to person/place/time  Psych - normal affect, no anxiety   Patient has Pressure Ulcer on Admission?: no   Data Review:    CBC Recent Labs  Lab 06/19/19 1028  WBC 9.4  HGB 15.9  HCT 47.8  PLT 171  MCV 90.7  MCH 30.2  MCHC 33.3  RDW 13.6  LYMPHSABS 1.2  MONOABS 0.6  EOSABS 0.0  BASOSABS 0.0   ------------------------------------------------------------------------------------------------------------------  Chemistries  Recent Labs  Lab 06/19/19 1028  NA 137  K 3.7  CL 100  CO2 26  GLUCOSE 105*  BUN 21*  CREATININE 1.21  CALCIUM 8.8*  AST 49*  ALT 41  ALKPHOS 59  BILITOT 0.9   ------------------------------------------------------------------------------------------------------------------ estimated creatinine clearance is 90.9 mL/min (by C-G formula based on SCr of 1.21 mg/dL). ------------------------------------------------------------------------------------------------------------------ No results for input(s): TSH, T4TOTAL, T3FREE, THYROIDAB in the last 72 hours.  Invalid input(s): FREET3  Coagulation profile No results for input(s): INR, PROTIME in the last 168 hours. ------------------------------------------------------------------------------------------------------------------- Recent Labs    06/19/19 1028  DDIMER 1.02*   -------------------------------------------------------------------------------------------------------------------  Cardiac Enzymes No results for input(s): CKMB, TROPONINI, MYOGLOBIN in the last 168 hours.  Invalid input(s): CK ------------------------------------------------------------------------------------------------------------------ No results found  for: BNP   ---------------------------------------------------------------------------------------------------------------  Urinalysis    Component Value Date/Time   COLORURINE YELLOW 08/18/2018 1730   APPEARANCEUR CLEAR 08/18/2018 1730   LABSPEC 1.020 08/18/2018 1730   PHURINE 7.0 08/18/2018 1730   GLUCOSEU 250 (A) 08/18/2018 1730   HGBUR NEGATIVE 08/18/2018 1730   BILIRUBINUR NEGATIVE 08/18/2018 1730   KETONESUR NEGATIVE 08/18/2018 1730   PROTEINUR NEGATIVE 08/18/2018 1730   NITRITE NEGATIVE 08/18/2018 1730   LEUKOCYTESUR NEGATIVE 08/18/2018 1730    ----------------------------------------------------------------------------------------------------------------  Imaging Results:    CT Angio Chest PE W and/or Wo Contrast  Result Date: 06/19/2019 CLINICAL DATA:  Recent COVID-19 positive. Shortness of breath with positive D-dimer. Fever. EXAM: CT ANGIOGRAPHY CHEST WITH CONTRAST TECHNIQUE: Multidetector CT imaging of the chest was performed using the standard protocol during bolus administration of intravenous contrast. Multiplanar CT image reconstructions and MIPs were obtained to evaluate the vascular anatomy. CONTRAST:  136mL OMNIPAQUE IOHEXOL 350 MG/ML SOLN COMPARISON:  Chest radiograph June 19, 2019 FINDINGS: Cardiovascular: There is no evident pulmonary embolus. There is no thoracic aortic aneurysm or dissection. Visualized great vessels appear unremarkable. Note that the right innominate and left common carotid arteries arise as a common trunk, an anatomic variant. No pericardial effusion or pericardial thickening is evident. Mediastinum/Nodes: Visualized thyroid appears normal. There is no evident thoracic adenopathy. There is a fairly small focal hiatal hernia. Lungs/Pleura: There are multiple areas of ground-glass type opacity in each upper lobe, more severe on the left than on the right. Multifocal ground-glass opacity is noted in each lower lobe as well as in the inferior  lingula. There is no consolidation or pleural effusion evident. There is bibasilar atelectasis. Upper Abdomen: There is hepatic steatosis. Spleen is enlarged measuring 16.4 cm from anterior to posterior dimension. Spleen is incompletely visualized. Visualized upper abdominal structures otherwise appear unremarkable. Musculoskeletal: There are no blastic or lytic bone lesions. No chest wall lesions are evident. Review of the MIP images confirms the above findings. IMPRESSION: 1. No demonstrable pulmonary embolus. No thoracic aortic aneurysm or dissection. 2. Multifocal pneumonia with ground-glass type opacity appearance throughout the areas of infiltrate. Suspect atypical organism pneumonia. There is bibasilar atelectasis. 3.  No evident adenopathy. 4.  Hiatal hernia present. 5.  Splenic enlargement.  Note spleen incompletely visualized. 6.  Hepatic steatosis. Electronically Signed   By: Lowella Grip III M.D.   On: 06/19/2019 13:01   DG Chest Port 1 View  Result Date: 06/19/2019 CLINICAL DATA:  Fever. Cough and shortness of breath. COVID-19 positive EXAM: PORTABLE CHEST 1 VIEW COMPARISON:  February 13, 2018 FINDINGS: Lungs are clear. Heart is upper normal in size with pulmonary vascularity normal. No adenopathy. No bone lesions. IMPRESSION: Lungs clear.  No adenopathy.  Heart upper normal in size. Electronically Signed   By: Lowella Grip III M.D.   On: 06/19/2019 11:08    Assessment & Plan:    Principal Problem:   Acute hypoxemic respiratory failure due to severe acute respiratory syndrome coronavirus 2 (SARS-CoV-2) disease (HCC) Active Problems:   Gout   COVID-19 virus infection   Diabetes mellitus without complication ([place])   Hypertension     Acute hypoxemic respiratory failure due to SARS-CoV-2 disease/COVID-19 virus infection: Patient tested positive for SARS-CoV-2 12/28, now presents with worsening shortness of breath, found to be hypoxic and started on supplemental oxygen.   Subsequently transferred to [facility] for further management.  He was initiated on remdesivir and Decadron for to be continued as inpatient.    Gout: No evidence of acute gouty attack.  On steroids.  Continue the Uloric.      Diabetes mellitus without complication ([place]): Diabetic diet.  Sliding scale insulin.  Holding the Metformin.    Hypertension: Blood pressure is at goal.  Continue lisinopril hydrochlorothiazide.   DVT Prophylaxis Lovenox  AM Labs Ordered, also please review Full Orders  Family Communication: Admission, patients condition and plan of care including tests being ordered have been discussed with the patient who indicate understanding and agree with the plan  and Code Status.  Code Status full code  Likely DC to home  Condition GUARDED    Consults called: None  Admission status: Admit to inpatient  Time spent in minutes : 55   Peyton Bottoms M.D on 06/20/2019 at 3:15 AM  To page go to [email or web address] - password [facility]

## 2019-06-21 DIAGNOSIS — E119 Type 2 diabetes mellitus without complications: Secondary | ICD-10-CM

## 2019-06-21 DIAGNOSIS — U071 COVID-19: Secondary | ICD-10-CM | POA: Diagnosis not present

## 2019-06-21 DIAGNOSIS — J9601 Acute respiratory failure with hypoxia: Secondary | ICD-10-CM | POA: Diagnosis not present

## 2019-06-21 DIAGNOSIS — J1282 Pneumonia due to coronavirus disease 2019: Secondary | ICD-10-CM

## 2019-06-21 DIAGNOSIS — Z01110 Encounter for hearing examination following failed hearing screening: Secondary | ICD-10-CM | POA: Diagnosis not present

## 2019-06-21 LAB — CBC WITH DIFFERENTIAL/PLATELET
Abs Immature Granulocytes: 0.06 10*3/uL (ref 0.00–0.07)
Basophils Absolute: 0 10*3/uL (ref 0.0–0.1)
Basophils Relative: 0 %
Eosinophils Absolute: 0 10*3/uL (ref 0.0–0.5)
Eosinophils Relative: 0 %
HCT: 39.6 % (ref 39.0–52.0)
Hemoglobin: 13.3 g/dL (ref 13.0–17.0)
Immature Granulocytes: 1 %
Lymphocytes Relative: 11 %
Lymphs Abs: 1.4 10*3/uL (ref 0.7–4.0)
MCH: 30.1 pg (ref 26.0–34.0)
MCHC: 33.6 g/dL (ref 30.0–36.0)
MCV: 89.6 fL (ref 80.0–100.0)
Monocytes Absolute: 0.8 10*3/uL (ref 0.1–1.0)
Monocytes Relative: 6 %
Neutro Abs: 9.8 10*3/uL — ABNORMAL HIGH (ref 1.7–7.7)
Neutrophils Relative %: 82 %
Platelets: 216 10*3/uL (ref 150–400)
RBC: 4.42 MIL/uL (ref 4.22–5.81)
RDW: 13.2 % (ref 11.5–15.5)
WBC: 12 10*3/uL — ABNORMAL HIGH (ref 4.0–10.5)
nRBC: 0 % (ref 0.0–0.2)

## 2019-06-21 LAB — COMPREHENSIVE METABOLIC PANEL
ALT: 33 U/L (ref 0–44)
AST: 35 U/L (ref 15–41)
Albumin: 3.4 g/dL — ABNORMAL LOW (ref 3.5–5.0)
Alkaline Phosphatase: 51 U/L (ref 38–126)
Anion gap: 10 (ref 5–15)
BUN: 25 mg/dL — ABNORMAL HIGH (ref 6–20)
CO2: 22 mmol/L (ref 22–32)
Calcium: 8.9 mg/dL (ref 8.9–10.3)
Chloride: 105 mmol/L (ref 98–111)
Creatinine, Ser: 0.9 mg/dL (ref 0.61–1.24)
GFR calc Af Amer: >60 mL/min (ref >60)
GFR calc non Af Amer: >60 mL/min (ref >60)
Glucose, Bld: 184 mg/dL — ABNORMAL HIGH (ref 70–99)
Potassium: 3.9 mmol/L (ref 3.5–5.1)
Sodium: 137 mmol/L (ref 135–145)
Total Bilirubin: 1 mg/dL (ref 0.3–1.2)
Total Protein: 6.7 g/dL (ref 6.5–8.1)

## 2019-06-21 LAB — D-DIMER, QUANTITATIVE: D-Dimer, Quant: 0.63 ug/mL-FEU — ABNORMAL HIGH (ref 0.00–0.50)

## 2019-06-21 LAB — GLUCOSE, CAPILLARY
Glucose-Capillary: 216 mg/dL — ABNORMAL HIGH (ref 70–99)
Glucose-Capillary: 180 mg/dL — ABNORMAL HIGH (ref 70–99)
Glucose-Capillary: 158 mg/dL — ABNORMAL HIGH (ref 70–99)
Glucose-Capillary: 188 mg/dL — ABNORMAL HIGH (ref 70–99)

## 2019-06-21 LAB — FERRITIN: Ferritin: 1204 ng/mL — ABNORMAL HIGH (ref 24–336)

## 2019-06-21 LAB — C-REACTIVE PROTEIN: CRP: 6.9 mg/dL — ABNORMAL HIGH (ref <1.0)

## 2019-06-21 NOTE — Progress Notes (Signed)
PROGRESS NOTE  Steven Giles [id number] DOB: [birth year] DOA: 06/19/2019  PCP: Fanny Bien, MD  Brief History/Interval Summary:  54 y.o. male, with gout who presented as a transfer from [facility] with progressive shortness of breath, fatigue, myalgias.  He was doing well until 06/08/2019 when the symptoms began.  He tested positive for SARS-CoV-2 on 12/28.  He was hospitalized for further management.  Reason for Visit: Acute respiratory failure with hypoxia.  Pneumonia due to COVID-19  Consultants: None  Procedures: None  Antibiotics: Anti-infectives (From admission, onward)   Start     Dose/Rate Route Frequency Ordered Stop   06/20/19 1230  cefTRIAXone (ROCEPHIN) 1 g in sodium chloride 0.9 % 100 mL IVPB     1 g 200 mL/hr over 30 Minutes Intravenous Every 24 hours 06/20/19 1139 06/25/19 1229   06/20/19 1230  azithromycin (ZITHROMAX) tablet 500 mg     500 mg Oral Daily 06/20/19 1139 06/25/19 0959   06/20/19 1000  remdesivir 100 mg in sodium chloride 0.9 % 100 mL IVPB     100 mg 200 mL/hr over 30 Minutes Intravenous Daily 06/19/19 1545 06/24/19 0959   06/19/19 1600  remdesivir 100 mg in sodium chloride 0.9 % 100 mL IVPB     100 mg 200 mL/hr over 30 Minutes Intravenous Every hour 06/19/19 1545 06/19/19 1743      Subjective/Interval History: Patient states that he is feeling better.  Not as short of breath as before.  Continues to have a cough which is dry for the most part.  No chest pain.  No nausea vomiting.     Assessment/Plan:  Acute Hypoxic Resp. Failure/Pneumonia due to COVID-19   Recent Labs  Lab 06/19/19 1028 06/19/19 1055 06/20/19 0437 06/21/19 0200  DDIMER 1.02*  --  0.87* 0.63*  FERRITIN  --  1,451* 1,212* 1,204*  CRP  --  14.7* 15.8* 6.9*  ALT 41  --  38 33  PROCALCITON 21.84  --  14.25  --     Objective findings: Fever: Afebrile in the last 24 hours Oxygen requirements: Weaned down to room air this morning.  Saturating in the early 90s.     COVID 19 Therapeutics: Antibacterials: ceftriaxone and azithromycin started on 1/8 Remdesivir: Day 3 today Steroids: Dexamethasone 6 mg twice a day  Diuretics: Noted to be on hydrochlorothiazide Actemra: Not given Convalescent Plasma: Not given Vitamin C and Zinc: Continue PUD Prophylaxis: Pepcid DVT Prophylaxis:  Lovenox   Patient seems to be stable from a respiratory standpoint.  He has improved.  CT angiogram showed multifocal pneumonia.  No PE was noted.  Continue with remdesivir.  Continue steroids at current dose for another 24 hours.  Antibacterials were added due to elevated procalcitonin.  Recheck procalcitonin levels tomorrow.  CRP has improved to 6.9.  D-dimer 0.63.  Ferritin remains elevated.  Continue mobilization, out of bed to chair, incentive spirometry.  Check ambulatory pulse oximetry.  Diabetes mellitus type 2 without any complications CBGs reasonably well controlled.  Anticipate some hyperglycemia due to steroids.  HbA1c 7.1.  Continue SSI.  Holding Metformin.  Essential hypertension Blood pressure reasonably well controlled.  Lisinopril HCTZ being continued.  History of gout Continue Uloric.  Obesity Estimated body mass index is 36.77 kg/m as calculated from the following:   Height as of this encounter: 5' 9.5" (1.765 m).   Weight as of this encounter: 114.6 kg.  Incidental findings on CT scan Noted to have splenic enlargement and hepatic steatosis.  Follow-up by  PCP.  DVT Prophylaxis: Lovenox Code Status: Full code Family Communication: Wife was updated yesterday.  Will do so again today. Disposition Plan:  Hopefully will be able to return home when improved.   Medications:  Scheduled: . vitamin C  500 mg Oral Daily  . azithromycin  500 mg Oral Daily  . dexamethasone (DECADRON) injection  6 mg Intravenous Q12H  . docusate sodium  100 mg Oral Daily  . enoxaparin (LOVENOX) injection  55 mg Subcutaneous Q24H  . famotidine  20 mg Oral Daily  .  febuxostat  40 mg Oral Daily  . lisinopril  10 mg Oral Daily   And  . hydrochlorothiazide  12.5 mg Oral Daily  . insulin aspart  0-15 Units Subcutaneous TID WC  . insulin aspart  0-5 Units Subcutaneous QHS  . Ipratropium-Albuterol  1 puff Inhalation Q6H  . zinc sulfate  220 mg Oral Daily   Continuous: . cefTRIAXone (ROCEPHIN)  IV 1 g (06/21/19 1205)  . remdesivir 100 mg in NS 100 mL 100 mg (06/21/19 0954)   [id number], chlorpheniramine-HYDROcodone, guaiFENesin-dextromethorphan   Objective:  Vital Signs  Vitals:   06/20/19 2100 06/21/19 0019 06/21/19 0357 06/21/19 0749  BP: 116/86  114/73 105/72  Pulse: 85  81 74  Resp: 16  16 18   Temp: 97.7 F (36.5 C)  97.7 F (36.5 C) 98 F (36.7 C)  TempSrc: Oral  Oral Oral  SpO2: 92% 93% 94% 92%  Weight:      Height:        Intake/Output Summary (Last 24 hours) at 06/21/2019 1342 Last data filed at 06/21/2019 0652 Gross per 24 hour  Intake 1400 ml  Output --  Net 1400 ml   Filed Weights   06/19/19 1026 06/19/19 2214  Weight: 112.2 kg 114.6 kg    General appearance: Awake alert.  In no distress Resp: Less tachypneic.  Few crackles at the bases bilaterally.  No wheezing or rhonchi. Cardio: S1-S2 is normal regular.  No S3-S4.  No rubs murmurs or bruit GI: Abdomen is soft.  Nontender nondistended.  Bowel sounds are present normal.  No masses organomegaly Extremities: No edema.  Full range of motion of lower extremities. Neurologic: Alert and oriented x3.  No focal neurological deficits.    Lab Results:  Data Reviewed: I have personally reviewed following labs and imaging studies  CBC: Recent Labs  Lab 06/19/19 1028 06/20/19 0437 06/21/19 0200  WBC 9.4 8.5 12.0*  NEUTROABS 7.6 6.8 9.8*  HGB 15.9 13.8 13.3  HCT 47.8 41.7 39.6  MCV 90.7 90.7 89.6  PLT 171 201 [id number]    Basic Metabolic Panel: Recent Labs  Lab 06/19/19 1028 06/20/19 0437 06/21/19 0200  NA 137 137 137  K 3.7 3.7 3.9  CL 100 101 105  CO2 26  23 22   GLUCOSE 105* 140* 184*  BUN 21* 21* 25*  CREATININE 1.21 1.06 0.90  CALCIUM 8.8* 8.7* 8.9    GFR: Estimated Creatinine Clearance: 122.2 mL/min (by C-G formula based on SCr of 0.9 mg/dL).  Liver Function Tests: Recent Labs  Lab 06/19/19 1028 06/20/19 0437 06/21/19 0200  AST 49* 43* 35  ALT 41 38 33  ALKPHOS 59 57 51  BILITOT 0.9 0.7 1.0  PROT 8.0 7.3 6.7  ALBUMIN 4.1 3.5 3.4*    HbA1C: Recent Labs    06/20/19 0437  HGBA1C 7.1*    CBG: Recent Labs  Lab 06/20/19 1152 06/20/19 1622 06/20/19 2102 06/21/19 0748 06/21/19 1152  GLUCAP 193* 169*  171* 216* 180*    Lipid Profile: Recent Labs    06/19/19 1028  TRIG 193*     Anemia Panel: Recent Labs    06/20/19 0437 06/21/19 0200  FERRITIN 1,212* 1,204*    Recent Results (from the past 240 hour(s))  Blood Culture (routine x 2)     Status: None (Preliminary result)   Collection Time: 06/19/19 10:55 AM   Specimen: BLOOD  Result Value Ref Range Status   Specimen Description   Final    BLOOD LEFT ANTECUBITAL Performed at [facility], [address] [place], [place] [address]    Special Requests   Final    BOTTLES DRAWN AEROBIC AND ANAEROBIC Blood Culture adequate volume Performed at [facility], [address] [place], [place] [address]    Culture   Final    NO GROWTH 2 DAYS Performed at [facility], [place] [address] [place], [place] [address]    Report Status PENDING  Incomplete      Radiology Studies: No results found.     LOS: 2 days   [facility] Pager on [email or web address]  06/21/2019, 1:42 PM

## 2019-06-21 NOTE — Progress Notes (Signed)
Patient walked 3 laps in hallway with no assistive device or supplemental o2. O2 Sat stayed 94-97% with no dyspnea. Tolerated well, sitting back up in chair waiting TV. No voiced needs, no distress noted

## 2019-06-22 ENCOUNTER — Ambulatory Visit (HOSPITAL_COMMUNITY): Payer: Federal, State, Local not specified - PPO

## 2019-06-22 DIAGNOSIS — U071 COVID-19: Secondary | ICD-10-CM | POA: Diagnosis not present

## 2019-06-22 DIAGNOSIS — Z01110 Encounter for hearing examination following failed hearing screening: Secondary | ICD-10-CM | POA: Diagnosis not present

## 2019-06-22 DIAGNOSIS — J1282 Pneumonia due to coronavirus disease 2019: Secondary | ICD-10-CM | POA: Diagnosis not present

## 2019-06-22 LAB — CBC WITH DIFFERENTIAL/PLATELET
Abs Immature Granulocytes: 0.12 10*3/uL — ABNORMAL HIGH (ref 0.00–0.07)
Basophils Absolute: 0 10*3/uL (ref 0.0–0.1)
Basophils Relative: 0 %
Eosinophils Absolute: 0 10*3/uL (ref 0.0–0.5)
Eosinophils Relative: 0 %
HCT: 42.4 % (ref 39.0–52.0)
Hemoglobin: 13.9 g/dL (ref 13.0–17.0)
Immature Granulocytes: 1 %
Lymphocytes Relative: 9 %
Lymphs Abs: 1.4 10*3/uL (ref 0.7–4.0)
MCH: 30 pg (ref 26.0–34.0)
MCHC: 32.8 g/dL (ref 30.0–36.0)
MCV: 91.4 fL (ref 80.0–100.0)
Monocytes Absolute: 1.1 10*3/uL — ABNORMAL HIGH (ref 0.1–1.0)
Monocytes Relative: 7 %
Neutro Abs: 13.3 10*3/uL — ABNORMAL HIGH (ref 1.7–7.7)
Neutrophils Relative %: 83 %
Platelets: 267 10*3/uL (ref 150–400)
RBC: 4.64 MIL/uL (ref 4.22–5.81)
RDW: 13.4 % (ref 11.5–15.5)
WBC: 16 10*3/uL — ABNORMAL HIGH (ref 4.0–10.5)
nRBC: 0 % (ref 0.0–0.2)

## 2019-06-22 LAB — COMPREHENSIVE METABOLIC PANEL
ALT: 37 U/L (ref 0–44)
AST: 31 U/L (ref 15–41)
Albumin: 3.7 g/dL (ref 3.5–5.0)
Alkaline Phosphatase: 54 U/L (ref 38–126)
Anion gap: 11 (ref 5–15)
BUN: 31 mg/dL — ABNORMAL HIGH (ref 6–20)
CO2: 23 mmol/L (ref 22–32)
Calcium: 9 mg/dL (ref 8.9–10.3)
Chloride: 104 mmol/L (ref 98–111)
Creatinine, Ser: 0.95 mg/dL (ref 0.61–1.24)
GFR calc Af Amer: >60 mL/min (ref >60)
GFR calc non Af Amer: >60 mL/min (ref >60)
Glucose, Bld: 184 mg/dL — ABNORMAL HIGH (ref 70–99)
Potassium: 3.8 mmol/L (ref 3.5–5.1)
Sodium: 138 mmol/L (ref 135–145)
Total Bilirubin: 1 mg/dL (ref 0.3–1.2)
Total Protein: 7.2 g/dL (ref 6.5–8.1)

## 2019-06-22 LAB — GLUCOSE, CAPILLARY
Glucose-Capillary: 184 mg/dL — ABNORMAL HIGH (ref 70–99)
Glucose-Capillary: 232 mg/dL — ABNORMAL HIGH (ref 70–99)

## 2019-06-22 LAB — D-DIMER, QUANTITATIVE: D-Dimer, Quant: 0.4 ug/mL-FEU (ref 0.00–0.50)

## 2019-06-22 LAB — C-REACTIVE PROTEIN: CRP: 2 mg/dL — ABNORMAL HIGH (ref <1.0)

## 2019-06-22 LAB — FERRITIN: Ferritin: 1063 ng/mL — ABNORMAL HIGH (ref 24–336)

## 2019-06-22 MED ORDER — IPRATROPIUM-ALBUTEROL 20-100 MCG/ACT IN AERS: 1.0000 | INHALATION_SPRAY | Freq: Four times a day (QID) | RESPIRATORY_TRACT | 0 refills | Status: DC | PRN

## 2019-06-22 MED ORDER — AZITHROMYCIN 500 MG PO TABS: 500.0000 mg | ORAL_TABLET | Freq: Every day | ORAL | 0 refills | Status: AC

## 2019-06-22 MED ORDER — FAMOTIDINE 20 MG PO TABS: 20.0000 mg | ORAL_TABLET | Freq: Every day | ORAL | 0 refills | Status: DC

## 2019-06-22 MED ORDER — DEXAMETHASONE 2 MG PO TABS: ORAL_TABLET | ORAL | 0 refills | Status: DC

## 2019-06-22 MED ORDER — BENZONATATE 100 MG PO CAPS: 100.0000 mg | ORAL_CAPSULE | Freq: Three times a day (TID) | ORAL | 0 refills | Status: AC | PRN

## 2019-06-22 NOTE — Discharge Instructions (Signed)
You are scheduled for an outpatient infusion of Remdesivir at 1230PM on Monday 1/11.  Please report to [facility] at [address].  Drive to the security guard and tell them you are here for an infusion. They will direct you to the front entrance where we will come and get you.  For questions call [phone].  Thanks      COVID-19 COVID-19 is a respiratory infection that is caused by a virus called severe acute respiratory syndrome coronavirus 2 (SARS-CoV-2). The disease is also known as coronavirus disease or novel coronavirus. In some people, the virus may not cause any symptoms. In others, it may cause a serious infection. The infection can get worse quickly and can lead to complications, such as:  Pneumonia, or infection of the lungs.  Acute respiratory distress syndrome or ARDS. This is a condition in which fluid build-up in the lungs prevents the lungs from filling with air and passing oxygen into the blood.  Acute respiratory failure. This is a condition in which there is not enough oxygen passing from the lungs to the body or when carbon dioxide is not passing from the lungs out of the body.  Sepsis or septic shock. This is a serious bodily reaction to an infection.  Blood clotting problems.  Secondary infections due to bacteria or fungus.  Organ failure. This is when your body's organs stop working. The virus that causes COVID-19 is contagious. This means that it can spread from person to person through droplets from coughs and sneezes (respiratory secretions). What are the causes? This illness is caused by a virus. You may catch the virus by:  Breathing in droplets from an infected person. Droplets can be spread by a person breathing, speaking, singing, coughing, or sneezing.  Touching something, like a table or a doorknob, that was exposed to the virus (contaminated) and then touching your mouth, nose, or eyes. What increases the risk? Risk for infection You  are more likely to be infected with this virus if you:  Are within 6 feet (2 meters) of a person with COVID-19.  Provide care for or live with a person who is infected with COVID-19.  Spend time in crowded indoor spaces or live in shared housing. Risk for serious illness You are more likely to become seriously ill from the virus if you:  Are 52 years of age or older. The higher your age, the more you are at risk for serious illness.  Live in a nursing home or long-term care facility.  Have cancer.  Have a long-term (chronic) disease such as: ? Chronic lung disease, including chronic obstructive pulmonary disease or asthma. ? A long-term disease that lowers your body's ability to fight infection (immunocompromised). ? Heart disease, including heart failure, a condition in which the arteries that lead to the heart become narrow or blocked (coronary artery disease), a disease which makes the heart muscle thick, weak, or stiff (cardiomyopathy). ? Diabetes. ? Chronic kidney disease. ? Sickle cell disease, a condition in which red blood cells have an abnormal "sickle" shape. ? Liver disease.  Are obese. What are the signs or symptoms? Symptoms of this condition can range from mild to severe. Symptoms may appear any time from 2 to 14 days after being exposed to the virus. They include:  A fever or chills.  A cough.  Difficulty breathing.  Headaches, body aches, or muscle aches.  Runny or stuffy (congested) nose.  A sore throat.  New loss of taste  or smell. Some people may also have stomach problems, such as nausea, vomiting, or diarrhea. Other people may not have any symptoms of COVID-19. How is this diagnosed? This condition may be diagnosed based on:  Your signs and symptoms, especially if: ? You live in an area with a COVID-19 outbreak. ? You recently traveled to or from an area where the virus is common. ? You provide care for or live with a person who was diagnosed  with COVID-19. ? You were exposed to a person who was diagnosed with COVID-19.  A physical exam.  Lab tests, which may include: ? Taking a sample of fluid from the back of your nose and throat (nasopharyngeal fluid), your nose, or your throat using a swab. ? A sample of mucus from your lungs (sputum). ? Blood tests.  Imaging tests, which may include, X-rays, CT scan, or ultrasound. How is this treated? At present, there is no medicine to treat COVID-19. Medicines that treat other diseases are being used on a trial basis to see if they are effective against COVID-19. Your health care provider will talk with you about ways to treat your symptoms. For most people, the infection is mild and can be managed at home with rest, fluids, and over-the-counter medicines. Treatment for a serious infection usually takes places in a hospital intensive care unit (ICU). It may include one or more of the following treatments. These treatments are given until your symptoms improve.  Receiving fluids and medicines through an IV.  Supplemental oxygen. Extra oxygen is given through a tube in the nose, a face mask, or a hood.  Positioning you to lie on your stomach (prone position). This makes it easier for oxygen to get into the lungs.  Continuous positive airway pressure (CPAP) or bi-level positive airway pressure (BPAP) machine. This treatment uses mild air pressure to keep the airways open. A tube that is connected to a motor delivers oxygen to the body.  Ventilator. This treatment moves air into and out of the lungs by using a tube that is placed in your windpipe.  Tracheostomy. This is a procedure to create a hole in the neck so that a breathing tube can be inserted.  Extracorporeal membrane oxygenation (ECMO). This procedure gives the lungs a chance to recover by taking over the functions of the heart and lungs. It supplies oxygen to the body and removes carbon dioxide. Follow these instructions at  home: Lifestyle  If you are sick, stay home except to get medical care. Your health care provider will tell you how long to stay home. Call your health care provider before you go for medical care.  Rest at home as told by your health care provider.  Do not use any products that contain nicotine or tobacco, such as cigarettes, e-cigarettes, and chewing tobacco. If you need help quitting, ask your health care provider.  Return to your normal activities as told by your health care provider. Ask your health care provider what activities are safe for you. General instructions  Take over-the-counter and prescription medicines only as told by your health care provider.  Drink enough fluid to keep your urine pale yellow.  Keep all follow-up visits as told by your health care provider. This is important. How is this prevented?  There is no vaccine to help prevent COVID-19 infection. However, there are steps you can take to protect yourself and others from this virus. To protect yourself:   Do not travel to areas where COVID-19 is  a risk. The areas where COVID-19 is reported change often. To identify high-risk areas and travel restrictions, check the CDC travel website: FatFares.com.br  If you live in, or must travel to, an area where COVID-19 is a risk, take precautions to avoid infection. ? Stay away from people who are sick. ? Wash your hands often with soap and water for 20 seconds. If soap and water are not available, use an alcohol-based hand sanitizer. ? Avoid touching your mouth, face, eyes, or nose. ? Avoid going out in public, follow guidance from your state and local health authorities. ? If you must go out in public, wear a cloth face covering or face mask. Make sure your mask covers your nose and mouth. ? Avoid crowded indoor spaces. Stay at least 6 feet (2 meters) away from others. ? Disinfect objects and surfaces that are frequently touched every day. This may  include:  Counters and tables.  Doorknobs and light switches.  Sinks and faucets.  Electronics, such as phones, remote controls, keyboards, computers, and tablets. To protect others: If you have symptoms of COVID-19, take steps to prevent the virus from spreading to others.  If you think you have a COVID-19 infection, contact your health care provider right away. Tell your health care team that you think you may have a COVID-19 infection.  Stay home. Leave your house only to seek medical care. Do not use public transport.  Do not travel while you are sick.  Wash your hands often with soap and water for 20 seconds. If soap and water are not available, use alcohol-based hand sanitizer.  Stay away from other members of your household. Let healthy household members care for children and pets, if possible. If you have to care for children or pets, wash your hands often and wear a mask. If possible, stay in your own room, separate from others. Use a different bathroom.  Make sure that all people in your household wash their hands well and often.  Cough or sneeze into a tissue or your sleeve or elbow. Do not cough or sneeze into your hand or into the air.  Wear a cloth face covering or face mask. Make sure your mask covers your nose and mouth. Where to find more information  Centers for Disease Control and Prevention: PurpleGadgets.be  World Health Organization: https://www.castaneda.info/ Contact a health care provider if:  You live in or have traveled to an area where COVID-19 is a risk and you have symptoms of the infection.  You have had contact with someone who has COVID-19 and you have symptoms of the infection. Get help right away if:  You have trouble breathing.  You have pain or pressure in your chest.  You have confusion.  You have bluish lips and fingernails.  You have difficulty waking from sleep.  You have symptoms that get  worse. These symptoms may represent a serious problem that is an emergency. Do not wait to see if the symptoms will go away. Get medical help right away. Call your local emergency services (911 in the [place] Do not drive yourself to the hospital. Let the emergency medical personnel know if you think you have COVID-19. Summary  COVID-19 is a respiratory infection that is caused by a virus. It is also known as coronavirus disease or novel coronavirus. It can cause serious infections, such as pneumonia, acute respiratory distress syndrome, acute respiratory failure, or sepsis.  The virus that causes COVID-19 is contagious. This means that it can spread  from person to person through droplets from breathing, speaking, singing, coughing, or sneezing.  You are more likely to develop a serious illness if you are 29 years of age or older, have a weak immune system, live in a nursing home, or have chronic disease.  There is no medicine to treat COVID-19. Your health care provider will talk with you about ways to treat your symptoms.  Take steps to protect yourself and others from infection. Wash your hands often and disinfect objects and surfaces that are frequently touched every day. Stay away from people who are sick and wear a mask if you are sick. This information is not intended to replace advice given to you by your health care provider. Make sure you discuss any questions you have with your health care provider. Document Revised: 03/28/2019 Document Reviewed: 07/04/2018 Else[employer] Patient Education  [employer].

## 2019-06-22 NOTE — Discharge Planning (Signed)
Patient IV to be removed, once Rocephin infusion completed (per orders).  RN assessment and VS revealed stability for DC to home an then getting final dose Remdesivir as outpt infusion tomorrow at 1230 - patient informed.  Scripts sent to [facility] in [place] per patient request.  Talked about importance of self quarantining for additional 2 weeks beyond DC and also provided work note for employer.  Discussed monitoring self and others in the home for difficulty breathing, increased Lethargy or uncontrolled temp.  Also informed of needed PCP FU appt in 2 weeks.  Once ready, will be wheeled to front and family transporting home via car.

## 2019-06-22 NOTE — Discharge Summary (Signed)
[facility]  Physician Discharge Summary   Patient ID: Steven Giles MRN: [id number] DOB/AGE: 09-04-1965 54 y.o.  Admit date: 06/19/2019 Discharge date: 06/23/2019  PCP: Fanny Bien, MD  DISCHARGE DIAGNOSES:  Acute respiratory failure with hypoxia, resolved Pneumonia due to COVID-19 Diabetes mellitus type 2 without complications Essential hypertension Obesity Gout  RECOMMENDATIONS FOR OUTPATIENT FOLLOW UP: 1. Patient to come back for his fifth dose of remdesivir tomorrow at the infusion clinic 2. Patient found to have incidental findings on CT scan including splenic enlargement and hepatic steatosis which will need follow-up by PCP    Home Health: None Equipment/Devices: None  CODE STATUS: Full code  DISCHARGE CONDITION: fair  Diet recommendation: Modified carbohydrate  INITIAL HISTORY: 54 y.o.male,with gout who presented as a transfer from [facility] progressive shortness of breath, fatigue, myalgias. He was doing well until 06/08/2019 when the symptoms began. He tested positive for SARS-CoV-2 on 12/28.  He was hospitalized for further management.   HOSPITAL COURSE:   Acute Hypoxic Resp. Failure/Pneumonia due to COVID-19 Patient was hospitalized. Placed on remdesivir and steroid.  Due to elevated procalcitonin he was also placed on antibacterials.  He started improving.  He was weaned off of oxygen.  Fifth dose of remdesivir to be given in the infusion clinic tomorrow.  Patient feels much better.  He is ambulated.  He would like to go home today.  He will be discharged on oral azithromycin.  Steroid taper.  Saturating normal on room air.  Does not need home oxygen.  Diabetes mellitus type 2 without any complications P [id number] 7.1.  Continue home medications.    Essential hypertension Continue home medications  History of gout Continue Uloric.  Obesity Estimated body mass index is 36.77 kg/m as calculated from the following:   Height as of  this encounter: 5' 9.5" (1.765 m).   Weight as of this encounter: 114.6 kg.  Incidental findings on CT scan Noted to have splenic enlargement and hepatic steatosis.  Follow-up by PCP.   Overall stable.  Okay for discharge home today.   PERTINENT LABS:  The results of significant diagnostics from this hospitalization (including imaging, microbiology, ancillary and laboratory) are listed below for reference.    Microbiology: Recent Results (from the past 240 hour(s))  Blood Culture (routine x 2)     Status: None (Preliminary result)   Collection Time: 06/19/19 10:55 AM   Specimen: BLOOD  Result Value Ref Range Status   Specimen Description   Final    BLOOD LEFT ANTECUBITAL Performed at [facility], [address] [place], [place] [address]    Special Requests   Final    BOTTLES DRAWN AEROBIC AND ANAEROBIC Blood Culture adequate volume Performed at [facility], [address] [place], [place] [address]    Culture   Final    NO GROWTH 4 DAYS Performed at [facility], [place] [address], [place], [place] [address]    Report Status PENDING  Incomplete     Labs:  COVID-19 Labs  Recent Labs    06/21/19 0200 06/22/19 0655  DDIMER 0.63* 0.40  FERRITIN 1,204* 1,063*  CRP 6.9* 2.0*    Lab Results  Component Value Date   SARSCOV2NAA Detected (A) 06/09/2019   SARSCOV2NAA RESULT:  NEGATIVE 04/18/2019      Basic Metabolic Panel: Recent Labs  Lab 06/19/19 1028 06/20/19 0437 06/21/19 0200 06/22/19 0655  NA 137 137 137 138  K 3.7 3.7 3.9 3.8  CL 100 101 105 104  CO2 26 23 22 23   GLUCOSE 105* 140* 184* 184*  BUN 21* 21* 25* 31*  CREATININE 1.21 1.06 0.90 0.95  CALCIUM 8.8* 8.7* 8.9 9.0   Liver Function Tests: Recent Labs  Lab 06/19/19 1028 06/20/19 0437 06/21/19 0200 06/22/19 0655  AST 49* 43* 35 31  ALT 41 38 33 37  ALKPHOS 59 57 51 54  BILITOT 0.9 0.7 1.0 1.0  PROT 8.0 7.3 6.7 7.2  ALBUMIN 4.1 3.5 3.4* 3.7    CBC: Recent Labs  Lab 06/19/19 1028 06/20/19 0437 06/21/19 0200 06/22/19 0655  WBC 9.4 8.5 12.0* 16.0*  NEUTROABS 7.6 6.8 9.8* 13.3*  HGB 15.9 13.8 13.3 13.9  HCT 47.8 41.7 39.6 42.4  MCV 90.7 90.7 89.6 91.4  PLT 171 201 216 267   BNP: BNP (last 3 results) Recent Labs    06/20/19 0437  BNP 15.8    CBG: Recent Labs  Lab 06/21/19 1152 06/21/19 1625 06/21/19 2048 06/22/19 0722 06/22/19 1147  GLUCAP 180* 158* 188* 184* 232*     IMAGING STUDIES CT Angio Chest PE W and/or Wo Contrast  Result Date: 06/19/2019 CLINICAL DATA:  Recent COVID-19 positive. Shortness of breath with positive D-dimer. Fever. EXAM: CT ANGIOGRAPHY CHEST WITH CONTRAST TECHNIQUE: Multidetector CT imaging of the chest was performed using the standard protocol during bolus administration of intravenous contrast. Multiplanar CT image reconstructions and MIPs were obtained to evaluate the vascular anatomy. CONTRAST:  156mL OMNIPAQUE IOHEXOL 350 MG/ML SOLN COMPARISON:  Chest radiograph June 19, 2019 FINDINGS: Cardiovascular: There is no evident pulmonary embolus. There is no thoracic aortic aneurysm or dissection. Visualized great vessels appear unremarkable. Note that the right innominate and left common carotid arteries arise as a common trunk, an anatomic variant. No pericardial effusion or pericardial thickening is evident. Mediastinum/Nodes: Visualized thyroid appears normal. There is no evident thoracic adenopathy. There is a fairly small focal hiatal hernia. Lungs/Pleura: There are multiple areas of ground-glass type opacity in each upper lobe, more severe on the left than on the right. Multifocal ground-glass opacity is noted in each lower lobe as well as in the inferior lingula. There is no consolidation or pleural effusion evident. There is bibasilar atelectasis. Upper Abdomen: There is hepatic steatosis. Spleen is enlarged measuring 16.4 cm from anterior to posterior dimension. Spleen is incompletely  visualized. Visualized upper abdominal structures otherwise appear unremarkable. Musculoskeletal: There are no blastic or lytic bone lesions. No chest wall lesions are evident. Review of the MIP images confirms the above findings. IMPRESSION: 1. No demonstrable pulmonary embolus. No thoracic aortic aneurysm or dissection. 2. Multifocal pneumonia with ground-glass type opacity appearance throughout the areas of infiltrate. Suspect atypical organism pneumonia. There is bibasilar atelectasis. 3.  No evident adenopathy. 4.  Hiatal hernia present. 5.  Splenic enlargement.  Note spleen incompletely visualized. 6.  Hepatic steatosis. Electronically Signed   By: Lowella Grip III M.D.   On: 06/19/2019 13:01   DG Chest Port 1 View  Result Date: 06/19/2019 CLINICAL DATA:  Fever. Cough and shortness of breath. COVID-19 positive EXAM: PORTABLE CHEST 1 VIEW COMPARISON:  February 13, 2018 FINDINGS: Lungs are clear. Heart is upper normal in size with pulmonary vascularity normal. No adenopathy. No bone lesions. IMPRESSION: Lungs clear.  No adenopathy.  Heart upper normal in size. Electronically Signed   By: Lowella Grip III M.D.   On: 06/19/2019 11:08    DISCHARGE EXAMINATION: Vitals:   06/21/19 2000 06/22/19 [id number] 06/22/19 [id number] 06/22/19 [id number]  BP: 118/75 113/72 102/77 112/82  Pulse: 77 66 66   Resp: 16 18 20    Temp: 98.3 F (36.8 C) 97.9 F (36.6 C) 97.8 F (36.6 C)   TempSrc: Oral Oral Oral   SpO2: 92% 95% 93%   Weight:      Height:       General appearance: Awake alert.  In no distress Resp: Clear to auscultation bilaterally.  Normal effort Cardio: S1-S2 is normal regular.  No S3-S4.  No rubs murmurs or bruit GI: Abdomen is soft.  Nontender nondistended.  Bowel sounds are present normal.  No masses organomegaly   DISPOSITION: Home  Discharge Instructions    MyChart COVID-19 home monitoring program   Complete by: Jun 22, 2019    Is the patient willing to use the [facility] for home  monitoring?: Yes   Temperature monitoring   Complete by: Jun 22, 2019    After how many days would you like to receive a notification of this patient's flowsheet entries?: 1   Call MD for:  difficulty breathing, headache or visual disturbances   Complete by: As directed    Call MD for:  extreme fatigue   Complete by: As directed    Call MD for:  persistant dizziness or light-headedness   Complete by: As directed    Call MD for:  persistant nausea and vomiting   Complete by: As directed    Call MD for:  severe uncontrolled pain   Complete by: As directed    Call MD for:  temperature >100.4   Complete by: As directed    Diet Carb Modified   Complete by: As directed    Discharge instructions   Complete by: As directed    Please take your medications as prescribed.  Please show up at the infusion clinic as instructed tomorrow for your last dose of remdesivir.  COVID 19 INSTRUCTIONS  - You are felt to be stable enough to no longer require inpatient monitoring, testing, and treatment, though you will need to follow the recommendations below: - Based on the CDC's non-test criteria for ending self-isolation: You may not return to work/leave the home until at least 21 days since symptom onset AND 3 days without a fever (without taking tylenol, ibuprofen, etc.) AND have improvement in respiratory symptoms. - Do not take NSAID medications (including, but not limited to, ibuprofen, advil, motrin, naproxen, aleve, goody's powder, etc.) - Follow up with your doctor in the next week via telehealth or seek medical attention right away if your symptoms get WORSE.  - Consider donating plasma after you have recovered (either 14 days after a negative test or 28 days after symptoms have completely resolved) because your antibodies to this virus may be helpful to give to others with life-threatening infections. Please go to the website www.[email or web address] if you would like to consider volunteering for plasma  donation.    Directions for you at home:  Wear a facemask You should wear a facemask that covers your nose and mouth when you are in the same room with other people and when you visit a healthcare provider. People who live with or visit you should also wear a facemask while they are in the same room with you.  Separate yourself from other people in your home As much as possible, you should stay in a different room from other people in your home. Also, you should use a separate bathroom, if available.  Avoid sharing household items You should not  share dishes, drinking glasses, cups, eating utensils, towels, bedding, or other items with other people in your home. After using these items, you should wash them thoroughly with soap and water.  Cover your coughs and sneezes Cover your mouth and nose with a tissue when you cough or sneeze, or you can cough or sneeze into your sleeve. Throw used tissues in a lined trash can, and immediately wash your hands with soap and water for at least 20 seconds or use an alcohol-based hand rub.  Wash your [facility] your hands often and thoroughly with soap and water for at least 20 seconds. You can use an alcohol-based hand sanitizer if soap and water are not available and if your hands are not visibly dirty. Avoid touching your eyes, nose, and mouth with unwashed hands.  Directions for those who live with, or provide care at home for you:  Limit the number of people who have contact with the patient If possible, have only one caregiver for the patient. Other household members should stay in another home or place of residence. If this is not possible, they should stay in another room, or be separated from the patient as much as possible. Use a separate bathroom, if available. Restrict visitors who do not have an essential need to be in the home.  Ensure good ventilation Make sure that shared spaces in the home have good air flow, such as from an  air conditioner or an opened window, weather permitting.  Wash your hands often Wash your hands often and thoroughly with soap and water for at least 20 seconds. You can use an alcohol based hand sanitizer if soap and water are not available and if your hands are not visibly dirty. Avoid touching your eyes, nose, and mouth with unwashed hands. Use disposable paper towels to dry your hands. If not available, use dedicated cloth towels and replace them when they become wet.  Wear a facemask and gloves Wear a disposable facemask at all times in the room and gloves when you touch or have contact with the patients blood, body fluids, and/or secretions or excretions, such as sweat, saliva, sputum, nasal mucus, vomit, urine, or feces.  Ensure the mask fits over your nose and mouth tightly, and do not touch it during use. Throw out disposable facemasks and gloves after using them. Do not reuse. Wash your hands immediately after removing your facemask and gloves. If your personal clothing becomes contaminated, carefully remove clothing and launder. Wash your hands after handling contaminated clothing. Place all used disposable facemasks, gloves, and other waste in a lined container before disposing them with other household waste. Remove gloves and wash your hands immediately after handling these items.  Do not share dishes, glasses, or other household items with the patient Avoid sharing household items. You should not share dishes, drinking glasses, cups, eating utensils, towels, bedding, or other items with a patient who is confirmed to have, or being evaluated for, COVID-19 infection. After the person uses these items, you should wash them thoroughly with soap and water.  Wash laundry thoroughly Immediately remove and wash clothes or bedding that have blood, body fluids, and/or secretions or excretions, such as sweat, saliva, sputum, nasal mucus, vomit, urine, or feces, on them. Wear gloves when  handling laundry from the patient. Read and follow directions on labels of laundry or clothing items and detergent. In general, wash and dry with the warmest temperatures recommended on the label.  Clean all areas the individual has  used often Clean all touchable surfaces, such as counters, tabletops, doorknobs, bathroom fixtures, toilets, phones, keyboards, tablets, and bedside tables, every day. Also, clean any surfaces that may have blood, body fluids, and/or secretions or excretions on them. Wear gloves when cleaning surfaces the patient has come in contact with. Use a diluted bleach solution (e.g., dilute bleach with 1 part bleach and 10 parts water) or a household disinfectant with a label that says EPA-registered for coronaviruses. To make a bleach solution at home, add 1 tablespoon of bleach to 1 quart (4 cups) of water. For a larger supply, add  cup of bleach to 1 gallon (16 cups) of water. Read labels of cleaning products and follow recommendations provided on product labels. Labels contain instructions for safe and effective use of the cleaning product including precautions you should take when applying the product, such as wearing gloves or eye protection and making sure you have good ventilation during use of the product. Remove gloves and wash hands immediately after cleaning.  Monitor yourself for signs and symptoms of illness Caregivers and household members are considered close contacts, should monitor their health, and will be asked to limit movement outside of the home to the extent possible. Follow the monitoring steps for close contacts listed on the symptom monitoring form.   If you have additional questions, contact your local health department or call the epidemiologist on call at [phone] (available 24/7). This guidance is subject to change. For the most up-to-date guidance from [facility], please refer to their  website: YouBlogs.pl   You were cared for by a hospitalist during your hospital stay. If you have any questions about your discharge medications or the care you received while you were in the hospital after you are discharged, you can call the unit and asked to speak with the hospitalist on call if the hospitalist that took care of you is not available. Once you are discharged, your primary care physician will handle any further medical issues. Please note that NO REFILLS for any discharge medications will be authorized once you are discharged, as it is imperative that you return to your primary care physician (or establish a relationship with a primary care physician if you do not have one) for your aftercare needs so that they can reassess your need for medications and monitor your lab values. If you do not have a primary care physician, you can call [phone] for a physician referral.   Increase activity slowly   Complete by: As directed         Allergies as of 06/22/2019      Reactions   Nasacort [triamcinolone] Hives   Nasacort       Medication List    TAKE these medications   acetaminophen 500 MG tablet Commonly known as: TYLENOL Take 1,000 mg by mouth every 6 (six) hours as needed for fever.   azithromycin 500 MG tablet Commonly known as: ZITHROMAX Take 1 tablet (500 mg total) by mouth daily for 2 days. Notes to patient: Next: 1/11 AM   benzonatate 100 MG capsule Commonly known as: Tessalon Perles Take 1 capsule (100 mg total) by mouth 3 (three) times daily as needed for cough.   cholecalciferol 25 MCG (1000 UNIT) tablet Commonly known as: VITAMIN D3 Take 1,000 Units by mouth daily. Notes to patient: Next: 1/11 AM   dexamethasone 2 MG tablet Commonly known as: DECADRON Take 3 tablets once daily for 3 days, then take 2 tablets once daily for 3 days, then  1 tablet once daily for 3 days, then STOP. Notes to  patient: Next: 1/11 AM )Follow taperd dosing CAREFULLY)   famotidine 20 MG tablet Commonly known as: PEPCID Take 1 tablet (20 mg total) by mouth daily for 10 days. Notes to patient: Next: 1/11 AM   febuxostat 40 MG tablet Commonly known as: ULORIC Take 40 mg by mouth daily. Notes to patient: Next: 1/11 AM   Ipratropium-Albuterol 20-100 MCG/ACT Aers respimat Commonly known as: COMBIVENT Inhale 1 puff into the lungs every 6 (six) hours as needed for wheezing.   lisinopril-hydrochlorothiazide 10-12.5 MG tablet Commonly known as: ZESTORETIC Take 1 tablet by mouth daily. Notes to patient: Next: 1/11 AM   metFORMIN 500 MG tablet Commonly known as: GLUCOPHAGE Take 500 mg by mouth 2 (two) times daily. Notes to patient: Next: 1/10 PM   multivitamin with minerals tablet Take 1 tablet by mouth daily. Notes to patient: Next: 1/11 AM   vitamin C 1000 MG tablet Take 500 mg by mouth daily. Notes to patient: Next: 1/11 AM   zinc gluconate 50 MG tablet Take 50 mg by mouth daily. Notes to patient: Next: 1/11 AM        Follow-up [place] Follow up on 06/23/2019.   Specialty: Internal Medicine Why: Patient scheduled for outpatient Remdesivir infusion at 1230PM on Monday 1/11.  Please advise them to report to [facility] at [address]  Drive to the security guard and tell them you are here for an infusion. They will direct you to t Contact information: [place] [phone]       Fanny Bien, MD. Schedule an appointment as soon as possible for a visit in 2 week(s).   Specialty: Family Medicine Contact information: [address] [place] [place] [address] [phone]           TOTAL DISCHARGE TIME: 37 minutes  [place]  [facility] Pager on [email or web address]  06/23/2019, 1:34 PM

## 2019-06-22 NOTE — Progress Notes (Signed)
Patient scheduled for outpatient Remdesivir infusion at 1230PM on Monday 1/11.  Please advise them to report to [facility] at [address].  Drive to the security guard and tell them you are here for an infusion. They will direct you to the front entrance where we will come and get you.  For questions call [phone].  Thanks

## 2019-06-23 ENCOUNTER — Ambulatory Visit (HOSPITAL_COMMUNITY)
Admission: RE | Admit: 2019-06-23 | Discharge: 2019-06-23 | Disposition: A | Discharge status: Home / Self Care | Payer: Federal, State, Local not specified - PPO | Source: Ambulatory Visit | Attending: Pulmonary Disease | Admitting: Pulmonary Disease

## 2019-06-23 DIAGNOSIS — U071 COVID-19: Secondary | ICD-10-CM | POA: Diagnosis not present

## 2019-06-23 DIAGNOSIS — J1289 Other viral pneumonia: Secondary | ICD-10-CM | POA: Diagnosis not present

## 2019-06-23 MED ORDER — SODIUM CHLORIDE 0.9 % IV SOLN
INTRAVENOUS | Status: DC | PRN
  Administered 2019-06-23: 13:00:00 250 mL via INTRAVENOUS

## 2019-06-23 MED ORDER — ALBUTEROL SULFATE HFA 108 (90 BASE) MCG/ACT IN AERS: 2.0000 | INHALATION_SPRAY | Freq: Once | RESPIRATORY_TRACT | Status: DC | PRN

## 2019-06-23 MED ORDER — EPINEPHRINE 0.3 MG/0.3ML IJ SOAJ: 0.3000 mg | Freq: Once | INTRAMUSCULAR | Status: DC | PRN

## 2019-06-23 MED ORDER — FAMOTIDINE IN NACL 20-0.9 MG/50ML-% IV SOLN: 20.0000 mg | Freq: Once | INTRAVENOUS | Status: DC | PRN

## 2019-06-23 MED ORDER — SODIUM CHLORIDE 0.9 % IV SOLN
INTRAVENOUS | Status: AC
  Administered 2019-06-23: 13:00:00 100 mg via INTRAVENOUS
  Filled 2019-06-23: qty 20

## 2019-06-23 MED ORDER — SODIUM CHLORIDE 0.9 % IV SOLN: 100.0000 mg | Freq: Once | INTRAVENOUS | Status: AC

## 2019-06-23 MED ORDER — METHYLPREDNISOLONE SODIUM SUCC 125 MG IJ SOLR: 125.0000 mg | Freq: Once | INTRAMUSCULAR | Status: DC | PRN

## 2019-06-23 MED ORDER — DIPHENHYDRAMINE HCL 50 MG/ML IJ SOLN: 50.0000 mg | Freq: Once | INTRAMUSCULAR | Status: DC | PRN

## 2019-06-23 NOTE — Progress Notes (Signed)
  Diagnosis: COVID-19  Physician: [place] Pink  Procedure: [facility] Infusion Clinic Med: remdesivir infusion.  Complications: No immediate complications noted.  Discharge: Discharged home   [place], Remo Lipps 06/23/2019

## 2019-06-24 DIAGNOSIS — E119 Type 2 diabetes mellitus without complications: Secondary | ICD-10-CM | POA: Diagnosis not present

## 2019-06-24 DIAGNOSIS — U071 COVID-19: Secondary | ICD-10-CM | POA: Diagnosis not present

## 2019-06-24 DIAGNOSIS — E860 Dehydration: Secondary | ICD-10-CM | POA: Diagnosis not present

## 2019-06-24 DIAGNOSIS — J189 Pneumonia, unspecified organism: Secondary | ICD-10-CM | POA: Diagnosis not present

## 2019-06-24 LAB — CULTURE, BLOOD (ROUTINE X 2)
Culture: NO GROWTH
Special Requests: ADEQUATE

## 2019-07-03 DIAGNOSIS — G4730 Sleep apnea, unspecified: Secondary | ICD-10-CM | POA: Diagnosis not present

## 2019-07-08 DIAGNOSIS — M109 Gout, unspecified: Secondary | ICD-10-CM | POA: Diagnosis not present

## 2019-07-08 DIAGNOSIS — I10 Essential (primary) hypertension: Secondary | ICD-10-CM | POA: Diagnosis not present

## 2019-07-08 DIAGNOSIS — J189 Pneumonia, unspecified organism: Secondary | ICD-10-CM | POA: Diagnosis not present

## 2019-07-08 DIAGNOSIS — E119 Type 2 diabetes mellitus without complications: Secondary | ICD-10-CM | POA: Diagnosis not present

## 2019-07-09 ENCOUNTER — Other Ambulatory Visit: Payer: Self-pay

## 2019-07-11 DIAGNOSIS — I10 Essential (primary) hypertension: Secondary | ICD-10-CM | POA: Diagnosis not present

## 2019-07-11 DIAGNOSIS — N390 Urinary tract infection, site not specified: Secondary | ICD-10-CM | POA: Diagnosis not present

## 2019-07-11 DIAGNOSIS — E119 Type 2 diabetes mellitus without complications: Secondary | ICD-10-CM | POA: Diagnosis not present

## 2019-07-24 DIAGNOSIS — E7889 Other lipoprotein metabolism disorders: Secondary | ICD-10-CM | POA: Diagnosis not present

## 2019-07-24 DIAGNOSIS — M109 Gout, unspecified: Secondary | ICD-10-CM | POA: Diagnosis not present

## 2019-07-24 DIAGNOSIS — I10 Essential (primary) hypertension: Secondary | ICD-10-CM | POA: Diagnosis not present

## 2019-07-24 DIAGNOSIS — E119 Type 2 diabetes mellitus without complications: Secondary | ICD-10-CM | POA: Diagnosis not present

## 2019-07-29 DIAGNOSIS — I10 Essential (primary) hypertension: Secondary | ICD-10-CM | POA: Diagnosis not present

## 2019-07-29 DIAGNOSIS — N390 Urinary tract infection, site not specified: Secondary | ICD-10-CM | POA: Diagnosis not present

## 2019-07-29 DIAGNOSIS — E119 Type 2 diabetes mellitus without complications: Secondary | ICD-10-CM | POA: Diagnosis not present

## 2019-07-29 DIAGNOSIS — M109 Gout, unspecified: Secondary | ICD-10-CM | POA: Diagnosis not present

## 2019-08-13 DIAGNOSIS — Z23 Encounter for immunization: Secondary | ICD-10-CM | POA: Diagnosis not present

## 2019-10-23 DIAGNOSIS — I10 Essential (primary) hypertension: Secondary | ICD-10-CM | POA: Diagnosis not present

## 2019-10-23 DIAGNOSIS — M109 Gout, unspecified: Secondary | ICD-10-CM | POA: Diagnosis not present

## 2019-10-23 DIAGNOSIS — E782 Mixed hyperlipidemia: Secondary | ICD-10-CM | POA: Diagnosis not present

## 2019-10-23 DIAGNOSIS — E119 Type 2 diabetes mellitus without complications: Secondary | ICD-10-CM | POA: Diagnosis not present

## 2019-10-28 DIAGNOSIS — E782 Mixed hyperlipidemia: Secondary | ICD-10-CM | POA: Diagnosis not present

## 2019-10-28 DIAGNOSIS — E119 Type 2 diabetes mellitus without complications: Secondary | ICD-10-CM | POA: Diagnosis not present

## 2019-10-28 DIAGNOSIS — M109 Gout, unspecified: Secondary | ICD-10-CM | POA: Diagnosis not present

## 2019-10-28 DIAGNOSIS — I10 Essential (primary) hypertension: Secondary | ICD-10-CM | POA: Diagnosis not present

## 2020-01-07 DIAGNOSIS — G4730 Sleep apnea, unspecified: Secondary | ICD-10-CM | POA: Diagnosis not present

## 2020-02-09 DIAGNOSIS — S86812A Strain of other muscle(s) and tendon(s) at lower leg level, left leg, initial encounter: Secondary | ICD-10-CM | POA: Diagnosis not present

## 2020-02-14 IMAGING — CR DG CHEST 2V
2 series · 2 of 2 positions shown · non-contrast
Comparison: 01/29/2013

CLINICAL DATA: Fever and cough

EXAM:
CHEST - 2 VIEW

[w chest pa]
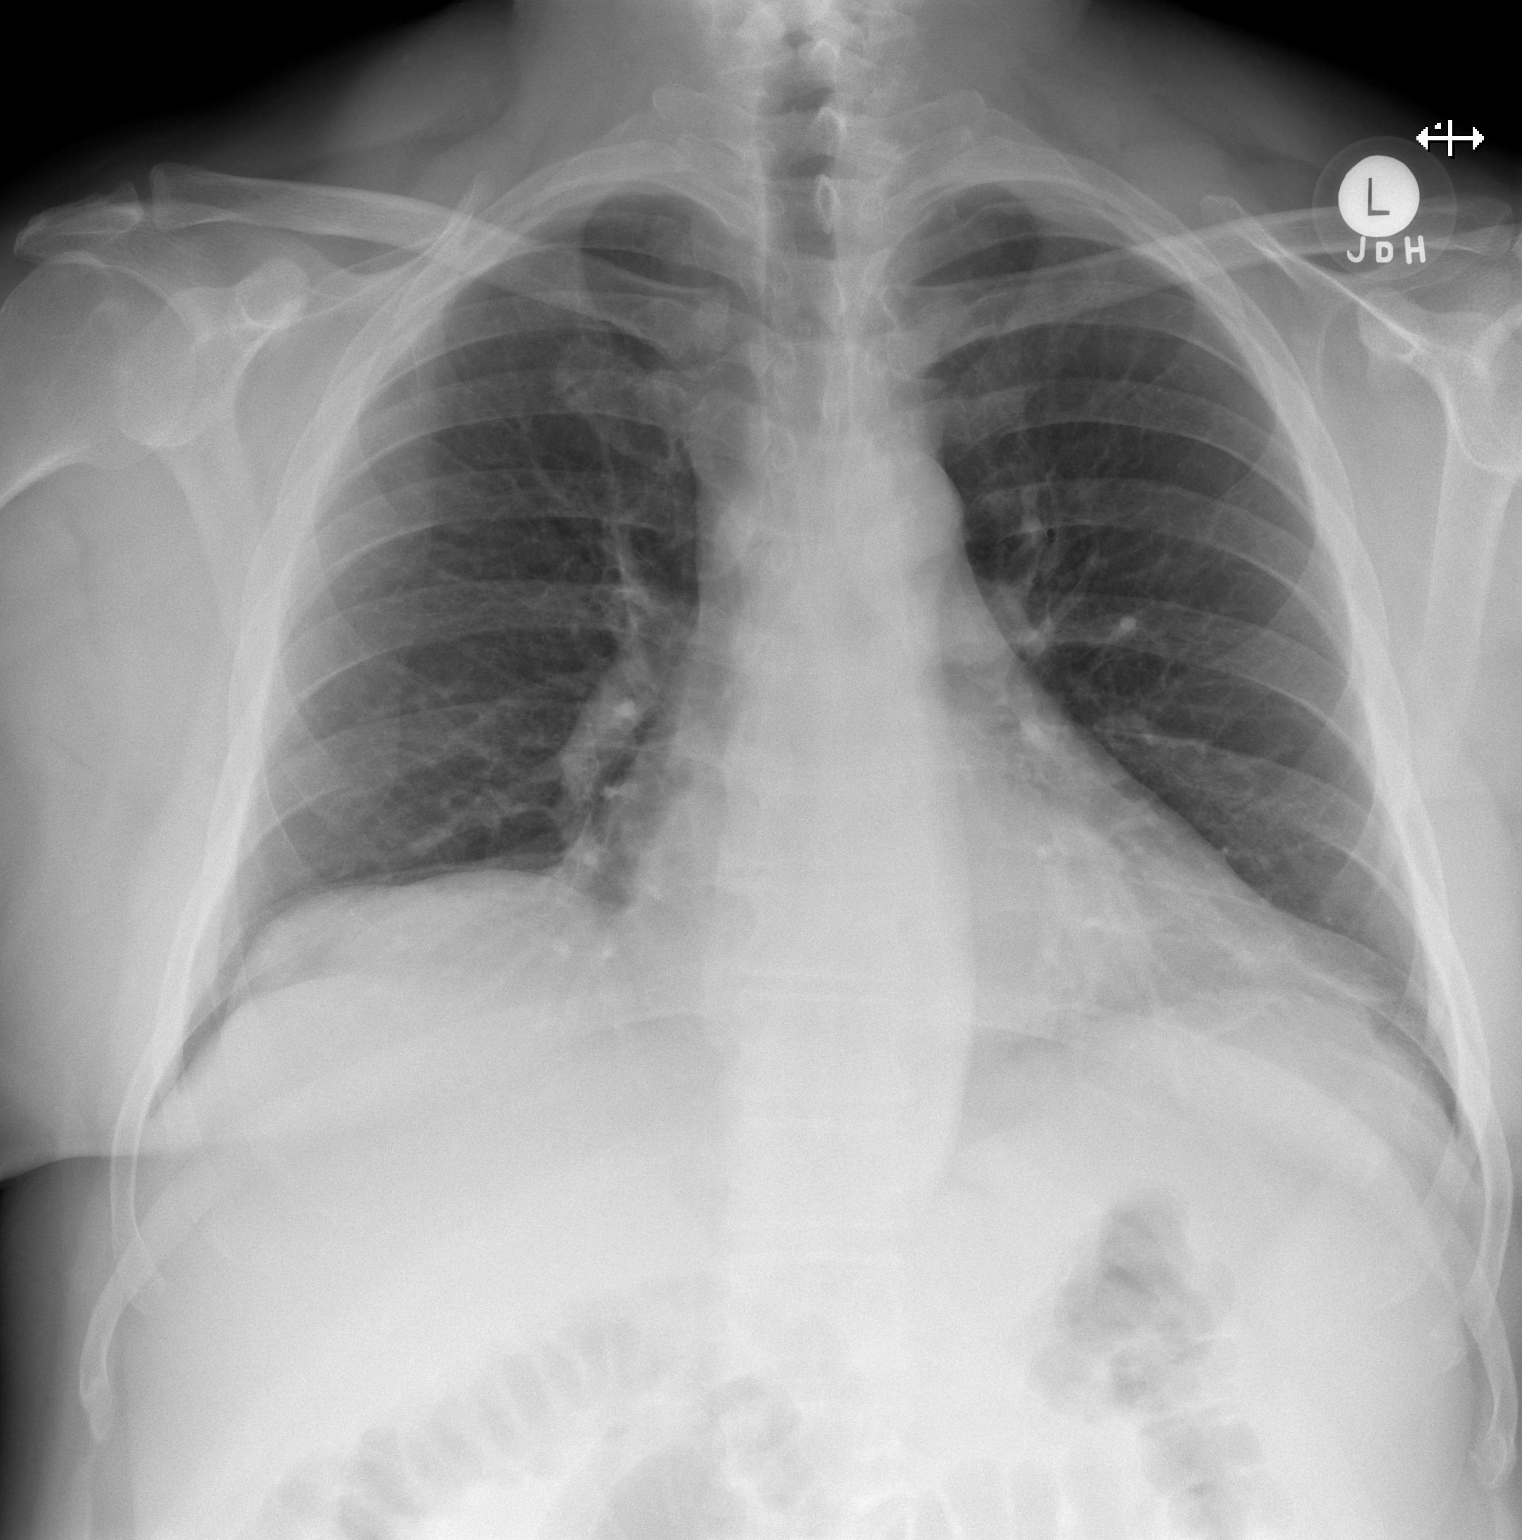

[w chest lat]
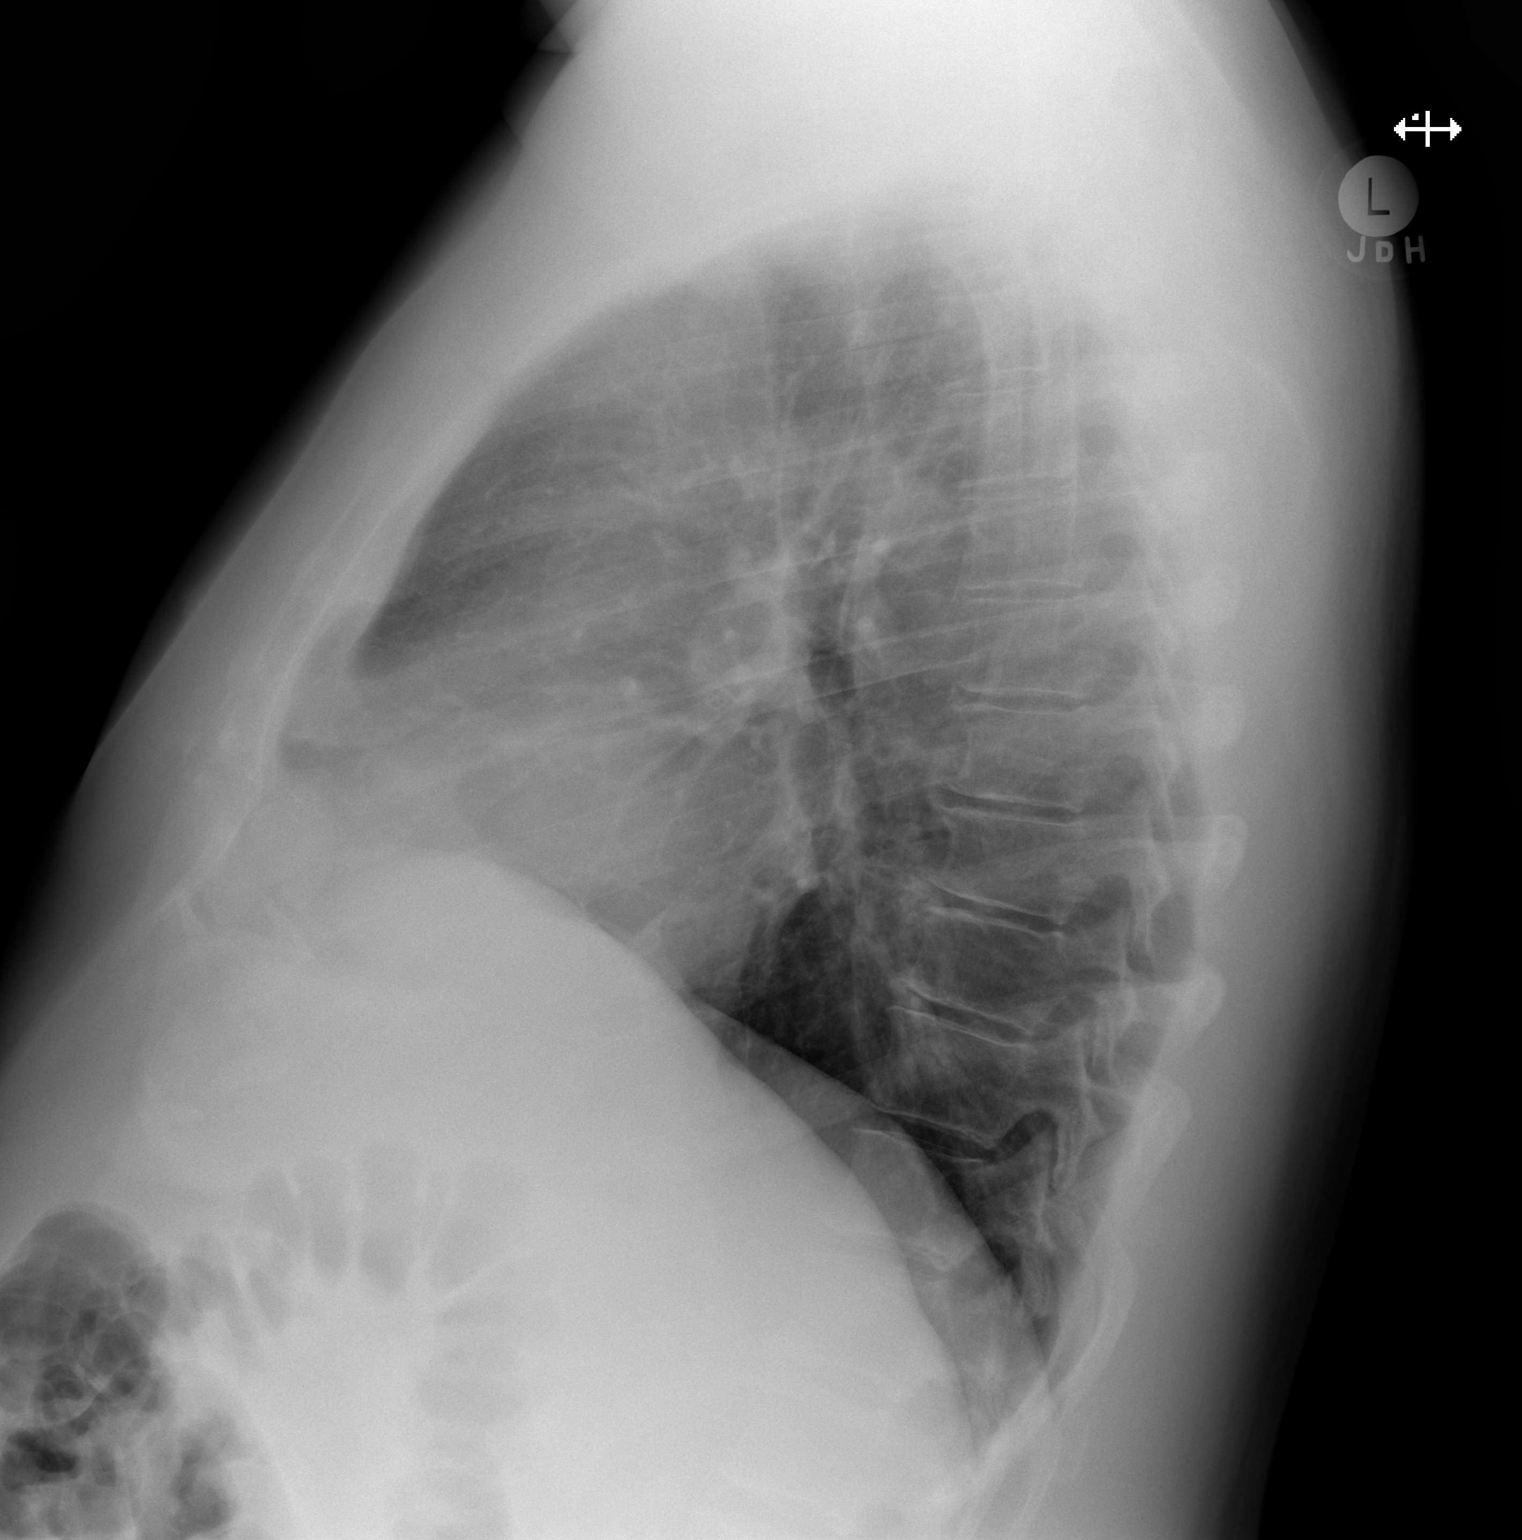

[2 of 2 positions shown; findings below may reference images not displayed]

FINDINGS: The heart size and mediastinal contours are within normal limits.
Both lungs are clear. The visualized skeletal structures are
unremarkable.
IMPRESSION: No active cardiopulmonary disease.

## 2020-02-25 DIAGNOSIS — E119 Type 2 diabetes mellitus without complications: Secondary | ICD-10-CM | POA: Diagnosis not present

## 2020-03-01 DIAGNOSIS — M109 Gout, unspecified: Secondary | ICD-10-CM | POA: Diagnosis not present

## 2020-03-01 DIAGNOSIS — E1165 Type 2 diabetes mellitus with hyperglycemia: Secondary | ICD-10-CM | POA: Diagnosis not present

## 2020-03-01 DIAGNOSIS — I10 Essential (primary) hypertension: Secondary | ICD-10-CM | POA: Diagnosis not present

## 2020-03-12 DIAGNOSIS — Z125 Encounter for screening for malignant neoplasm of prostate: Secondary | ICD-10-CM | POA: Diagnosis not present

## 2020-03-12 DIAGNOSIS — M109 Gout, unspecified: Secondary | ICD-10-CM | POA: Diagnosis not present

## 2020-03-12 DIAGNOSIS — Z0000 Encounter for general adult medical examination without abnormal findings: Secondary | ICD-10-CM | POA: Diagnosis not present

## 2020-03-12 DIAGNOSIS — Z1159 Encounter for screening for other viral diseases: Secondary | ICD-10-CM | POA: Diagnosis not present

## 2020-03-17 DIAGNOSIS — Z1211 Encounter for screening for malignant neoplasm of colon: Secondary | ICD-10-CM | POA: Diagnosis not present

## 2020-03-17 DIAGNOSIS — Z23 Encounter for immunization: Secondary | ICD-10-CM | POA: Diagnosis not present

## 2020-03-17 DIAGNOSIS — Z0000 Encounter for general adult medical examination without abnormal findings: Secondary | ICD-10-CM | POA: Diagnosis not present

## 2020-04-05 DIAGNOSIS — Z7185 Encounter for immunization safety counseling: Secondary | ICD-10-CM | POA: Diagnosis not present

## 2020-04-05 DIAGNOSIS — I10 Essential (primary) hypertension: Secondary | ICD-10-CM | POA: Diagnosis not present

## 2020-04-05 DIAGNOSIS — E782 Mixed hyperlipidemia: Secondary | ICD-10-CM | POA: Diagnosis not present

## 2020-04-05 DIAGNOSIS — E1165 Type 2 diabetes mellitus with hyperglycemia: Secondary | ICD-10-CM | POA: Diagnosis not present

## 2020-05-10 DIAGNOSIS — R5382 Chronic fatigue, unspecified: Secondary | ICD-10-CM | POA: Diagnosis not present

## 2020-05-10 DIAGNOSIS — I152 Hypertension secondary to endocrine disorders: Secondary | ICD-10-CM | POA: Diagnosis not present

## 2020-05-10 DIAGNOSIS — E669 Obesity, unspecified: Secondary | ICD-10-CM | POA: Diagnosis not present

## 2020-05-10 DIAGNOSIS — E1165 Type 2 diabetes mellitus with hyperglycemia: Secondary | ICD-10-CM | POA: Diagnosis not present

## 2020-05-13 DIAGNOSIS — J069 Acute upper respiratory infection, unspecified: Secondary | ICD-10-CM | POA: Diagnosis not present

## 2020-05-18 DIAGNOSIS — Z23 Encounter for immunization: Secondary | ICD-10-CM | POA: Diagnosis not present

## 2020-06-17 DIAGNOSIS — E559 Vitamin D deficiency, unspecified: Secondary | ICD-10-CM | POA: Diagnosis not present

## 2020-06-17 DIAGNOSIS — E1165 Type 2 diabetes mellitus with hyperglycemia: Secondary | ICD-10-CM | POA: Diagnosis not present

## 2020-06-17 DIAGNOSIS — I10 Essential (primary) hypertension: Secondary | ICD-10-CM | POA: Diagnosis not present

## 2020-06-17 DIAGNOSIS — R5383 Other fatigue: Secondary | ICD-10-CM | POA: Diagnosis not present

## 2020-06-22 DIAGNOSIS — E559 Vitamin D deficiency, unspecified: Secondary | ICD-10-CM | POA: Diagnosis not present

## 2020-06-22 DIAGNOSIS — E1169 Type 2 diabetes mellitus with other specified complication: Secondary | ICD-10-CM | POA: Diagnosis not present

## 2020-06-22 DIAGNOSIS — E782 Mixed hyperlipidemia: Secondary | ICD-10-CM | POA: Diagnosis not present

## 2020-08-25 DIAGNOSIS — G4730 Sleep apnea, unspecified: Secondary | ICD-10-CM | POA: Diagnosis not present

## 2020-09-28 DIAGNOSIS — M109 Gout, unspecified: Secondary | ICD-10-CM | POA: Diagnosis not present

## 2020-09-28 DIAGNOSIS — H9190 Unspecified hearing loss, unspecified ear: Secondary | ICD-10-CM | POA: Diagnosis not present

## 2020-09-28 DIAGNOSIS — H6121 Impacted cerumen, right ear: Secondary | ICD-10-CM | POA: Diagnosis not present

## 2020-09-28 DIAGNOSIS — H6092 Unspecified otitis externa, left ear: Secondary | ICD-10-CM | POA: Diagnosis not present

## 2020-09-28 DIAGNOSIS — I10 Essential (primary) hypertension: Secondary | ICD-10-CM | POA: Diagnosis not present

## 2020-10-01 DIAGNOSIS — E1165 Type 2 diabetes mellitus with hyperglycemia: Secondary | ICD-10-CM | POA: Diagnosis not present

## 2020-10-18 DIAGNOSIS — E1165 Type 2 diabetes mellitus with hyperglycemia: Secondary | ICD-10-CM | POA: Diagnosis not present

## 2020-12-22 DIAGNOSIS — R739 Hyperglycemia, unspecified: Secondary | ICD-10-CM | POA: Diagnosis not present

## 2020-12-22 DIAGNOSIS — Z23 Encounter for immunization: Secondary | ICD-10-CM | POA: Diagnosis not present

## 2020-12-22 DIAGNOSIS — E1165 Type 2 diabetes mellitus with hyperglycemia: Secondary | ICD-10-CM | POA: Diagnosis not present

## 2020-12-22 DIAGNOSIS — G4733 Obstructive sleep apnea (adult) (pediatric): Secondary | ICD-10-CM | POA: Diagnosis not present

## 2020-12-22 DIAGNOSIS — I10 Essential (primary) hypertension: Secondary | ICD-10-CM | POA: Diagnosis not present

## 2021-03-22 DIAGNOSIS — Z23 Encounter for immunization: Secondary | ICD-10-CM | POA: Diagnosis not present

## 2021-03-22 DIAGNOSIS — H6122 Impacted cerumen, left ear: Secondary | ICD-10-CM | POA: Diagnosis not present

## 2021-03-22 DIAGNOSIS — H6123 Impacted cerumen, bilateral: Secondary | ICD-10-CM | POA: Diagnosis not present

## 2021-03-22 DIAGNOSIS — H6121 Impacted cerumen, right ear: Secondary | ICD-10-CM | POA: Diagnosis not present

## 2021-03-22 DIAGNOSIS — Z0000 Encounter for general adult medical examination without abnormal findings: Secondary | ICD-10-CM | POA: Diagnosis not present

## 2021-03-30 DIAGNOSIS — E1165 Type 2 diabetes mellitus with hyperglycemia: Secondary | ICD-10-CM | POA: Diagnosis not present

## 2021-03-30 DIAGNOSIS — E782 Mixed hyperlipidemia: Secondary | ICD-10-CM | POA: Diagnosis not present

## 2021-03-30 DIAGNOSIS — I10 Essential (primary) hypertension: Secondary | ICD-10-CM | POA: Diagnosis not present

## 2021-04-09 DIAGNOSIS — G4730 Sleep apnea, unspecified: Secondary | ICD-10-CM | POA: Diagnosis not present

## 2021-06-01 ENCOUNTER — Institutional Professional Consult (permissible substitution): Payer: Federal, State, Local not specified - PPO | Admitting: Neurology

## 2021-06-19 IMAGING — CT CT ANGIO CHEST
2 of 8 series · 18 of 36 positions shown · IV contrast (Omnipaque)
Comparison: Chest radiograph June 19, 2019

CLINICAL DATA: Recent [id number] positive. Shortness of breath with
positive D-dimer. Fever.

EXAM:
CT ANGIOGRAPHY CHEST WITH CONTRAST
TECHNIQUE: Multidetector CT imaging of the chest was performed using the
standard protocol during bolus administration of intravenous
contrast. Multiplanar CT image reconstructions and MIPs were
obtained to evaluate the vascular anatomy.
CONTRAST:  100mL OMNIPAQUE IOHEXOL 350 MG/ML SOLN

[Series 6: pe coronal mpr · coronal · 0.64mm/px · 1 of 161 slices shown]
[im 81/161  mediastinal]
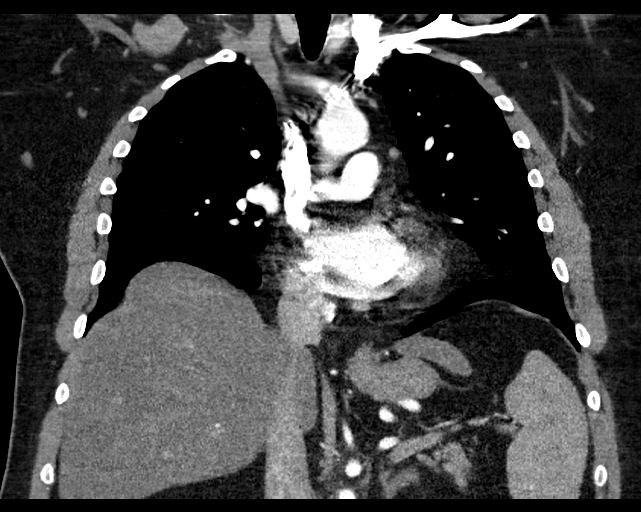

[Series 10: pe thins · axial · 0.81mm/px · z∈[-326,-48]mm · 17 of 312 slices shown]
[im 17/312  lung]
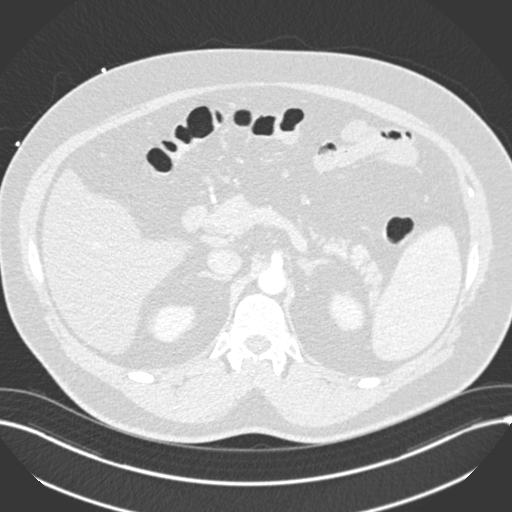
[im 33/312  mediastinal]
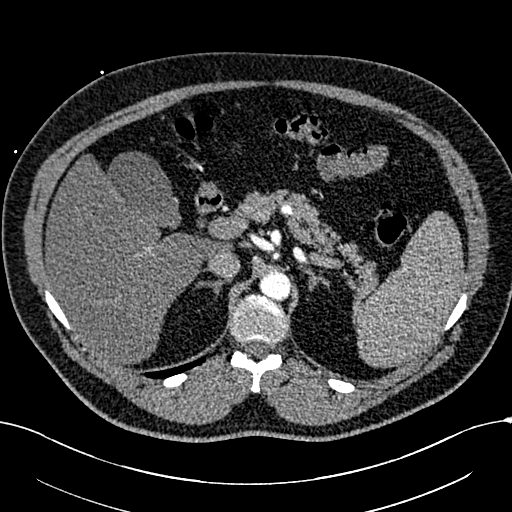
[im 50/312  lung]
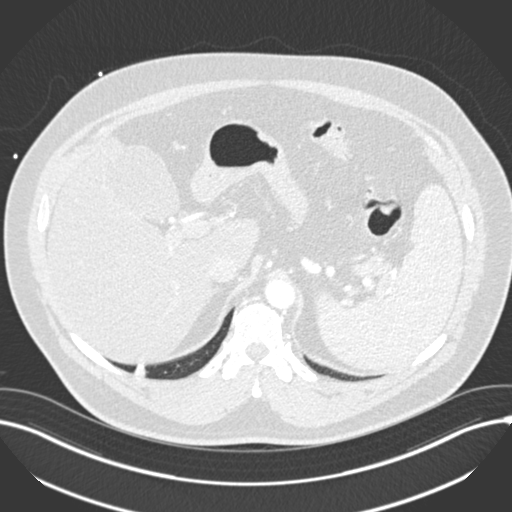
[im 66/312  mediastinal]
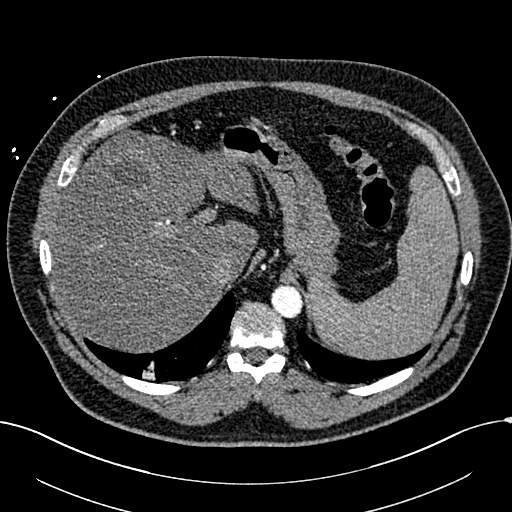
[im 82/312  lung]
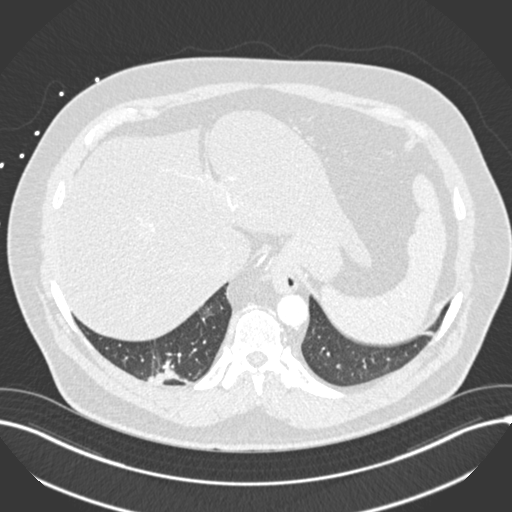
[im 99/312  mediastinal]
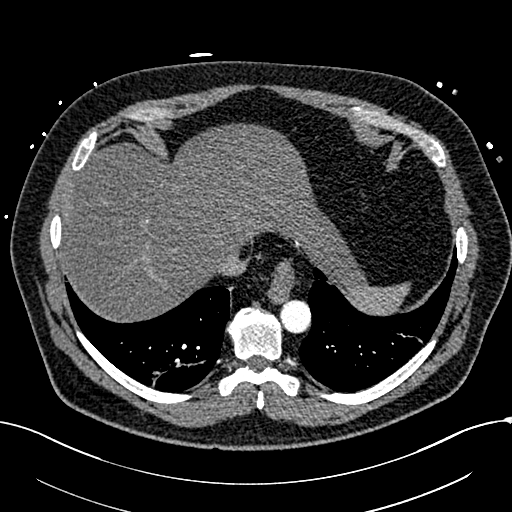
[im 115/312  lung]
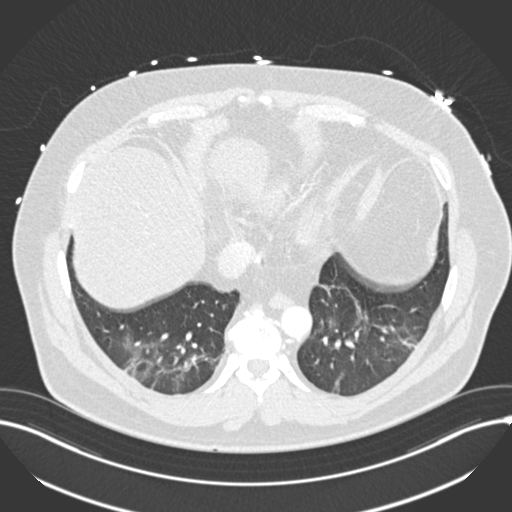
[im 131/312  mediastinal]
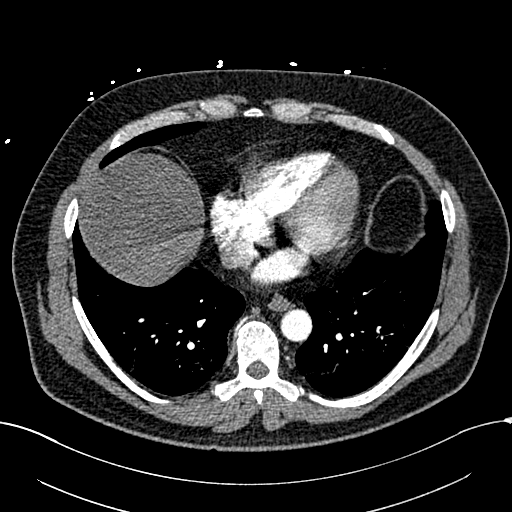
[im 164/312  lung]
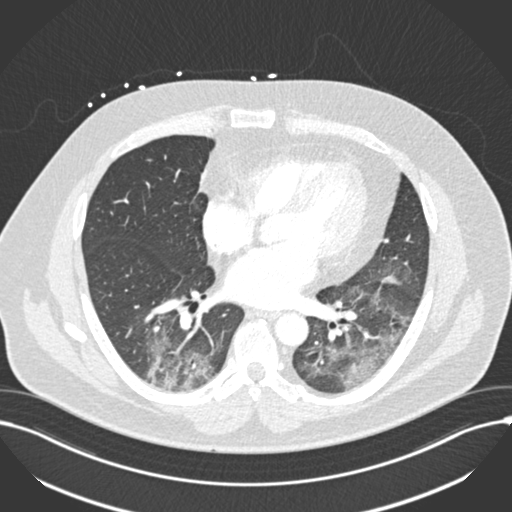
[im 181/312  mediastinal]
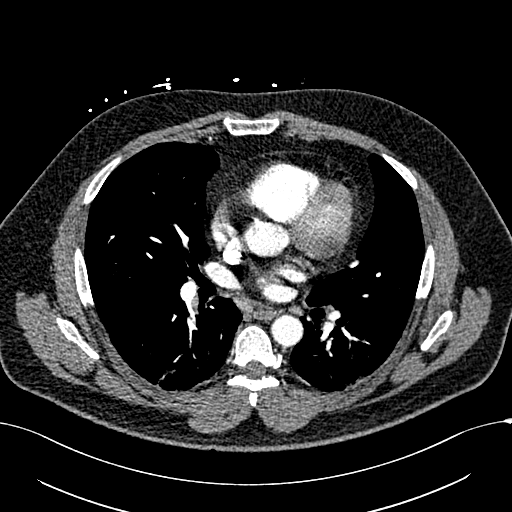
[im 197/312  lung]
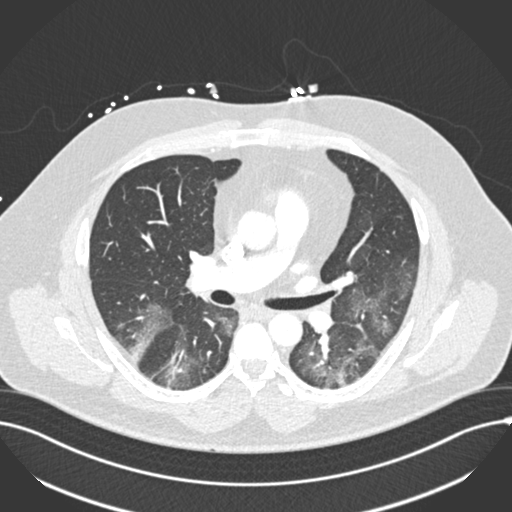
[im 213/312  mediastinal]
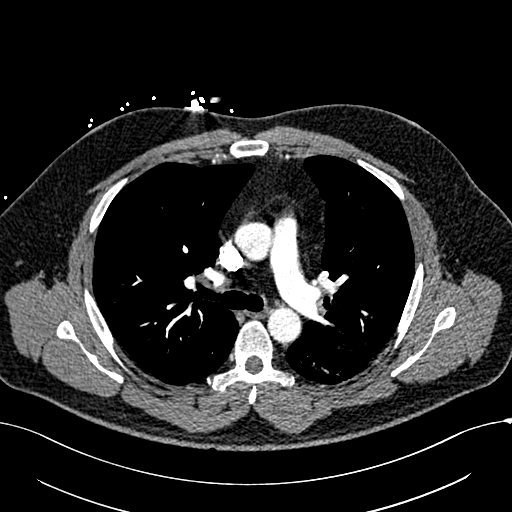
[im 230/312  lung]
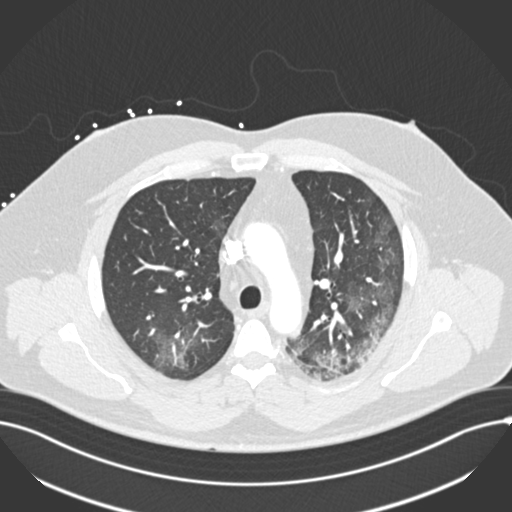
[im 246/312  mediastinal]
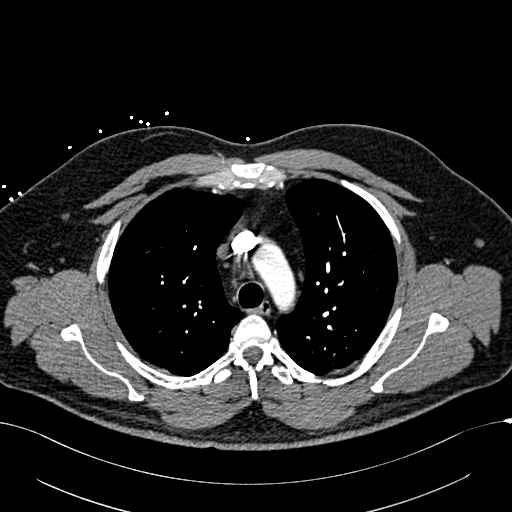
[im 262/312  lung]
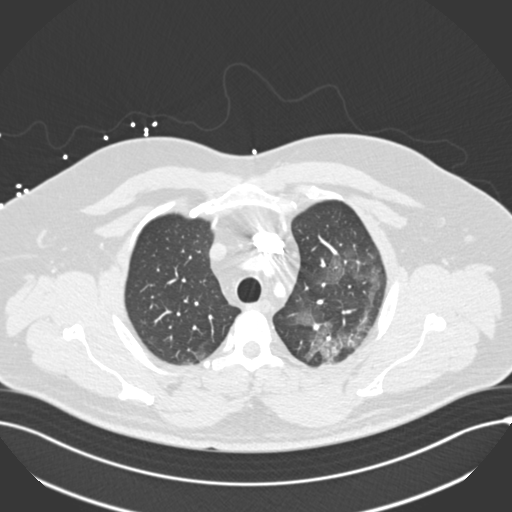
[im 279/312  mediastinal]
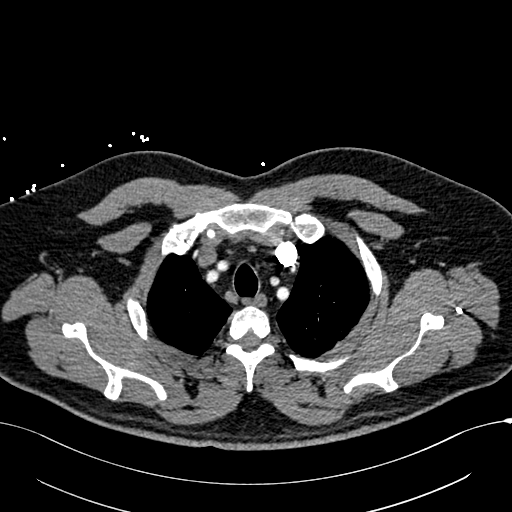
[im 295/312  lung]
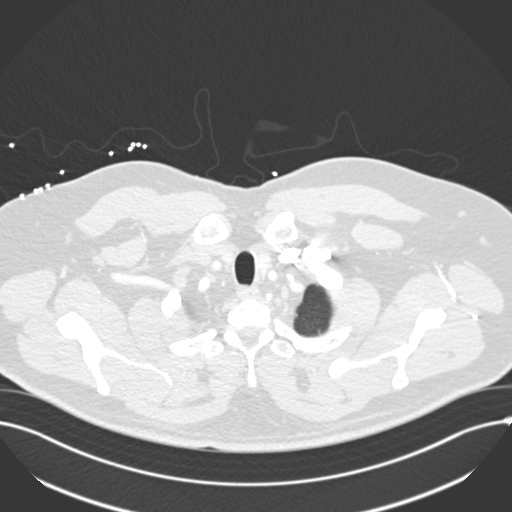

[18 of 36 positions shown; findings below may reference images not displayed]

FINDINGS: Cardiovascular: There is no evident pulmonary embolus. There is no
thoracic aortic aneurysm or dissection. Visualized great vessels
appear unremarkable. Note that the right innominate and left common
carotid arteries arise as a common trunk, an anatomic variant. No
pericardial effusion or pericardial thickening is evident.

Mediastinum/Nodes: Visualized thyroid appears normal. There is no
evident thoracic adenopathy. There is a fairly small focal hiatal
hernia.

Lungs/Pleura: There are multiple areas of ground-glass type opacity
in each upper lobe, more severe on the left than on the right.
Multifocal ground-glass opacity is noted in each lower lobe as well
as in the inferior lingula. There is no consolidation or pleural
effusion evident. There is bibasilar atelectasis.

Upper Abdomen: There is hepatic steatosis. Spleen is enlarged
measuring 16.4 cm from anterior to posterior dimension. Spleen is
incompletely visualized. Visualized upper abdominal structures
otherwise appear unremarkable.

Musculoskeletal: There are no blastic or lytic bone lesions. No
chest wall lesions are evident.

Review of the MIP images confirms the above findings.
IMPRESSION: 1. No demonstrable pulmonary embolus. No thoracic aortic aneurysm or
dissection.

2. Multifocal pneumonia with ground-glass type opacity appearance
throughout the areas of infiltrate. Suspect atypical organism
pneumonia. There is bibasilar atelectasis.

3.  No evident adenopathy.

4.  Hiatal hernia present.

5.  Splenic enlargement.  Note spleen incompletely visualized.

6.  Hepatic steatosis.

## 2021-06-20 DIAGNOSIS — L0291 Cutaneous abscess, unspecified: Secondary | ICD-10-CM | POA: Diagnosis not present

## 2021-06-20 DIAGNOSIS — E1165 Type 2 diabetes mellitus with hyperglycemia: Secondary | ICD-10-CM | POA: Diagnosis not present

## 2021-06-30 DIAGNOSIS — E119 Type 2 diabetes mellitus without complications: Secondary | ICD-10-CM | POA: Diagnosis not present

## 2021-06-30 DIAGNOSIS — H52203 Unspecified astigmatism, bilateral: Secondary | ICD-10-CM | POA: Diagnosis not present

## 2021-06-30 DIAGNOSIS — H5213 Myopia, bilateral: Secondary | ICD-10-CM | POA: Diagnosis not present

## 2021-07-08 DIAGNOSIS — E669 Obesity, unspecified: Secondary | ICD-10-CM | POA: Diagnosis not present

## 2021-07-08 DIAGNOSIS — I10 Essential (primary) hypertension: Secondary | ICD-10-CM | POA: Diagnosis not present

## 2021-07-08 DIAGNOSIS — E782 Mixed hyperlipidemia: Secondary | ICD-10-CM | POA: Diagnosis not present

## 2021-07-08 DIAGNOSIS — E1165 Type 2 diabetes mellitus with hyperglycemia: Secondary | ICD-10-CM | POA: Diagnosis not present

## 2021-07-15 DIAGNOSIS — G4730 Sleep apnea, unspecified: Secondary | ICD-10-CM | POA: Diagnosis not present

## 2021-07-22 DIAGNOSIS — B9689 Other specified bacterial agents as the cause of diseases classified elsewhere: Secondary | ICD-10-CM | POA: Diagnosis not present

## 2021-07-22 DIAGNOSIS — J329 Chronic sinusitis, unspecified: Secondary | ICD-10-CM | POA: Diagnosis not present

## 2021-07-22 DIAGNOSIS — J069 Acute upper respiratory infection, unspecified: Secondary | ICD-10-CM | POA: Diagnosis not present

## 2021-08-03 ENCOUNTER — Ambulatory Visit: Payer: Federal, State, Local not specified - PPO | Admitting: Neurology

## 2021-08-03 ENCOUNTER — Encounter: Payer: Self-pay | Admitting: Neurology

## 2021-08-03 VITALS — BP 142/94 | HR 68 | Ht 69.0 in | Wt 249.0 lb

## 2021-08-03 DIAGNOSIS — R635 Abnormal weight gain: Secondary | ICD-10-CM | POA: Diagnosis not present

## 2021-08-03 DIAGNOSIS — E669 Obesity, unspecified: Secondary | ICD-10-CM

## 2021-08-03 DIAGNOSIS — G4733 Obstructive sleep apnea (adult) (pediatric): Secondary | ICD-10-CM | POA: Diagnosis not present

## 2021-08-03 DIAGNOSIS — Z9989 Dependence on other enabling machines and devices: Secondary | ICD-10-CM | POA: Diagnosis not present

## 2021-08-03 NOTE — Progress Notes (Signed)
Subjective:    Patient ID: [name] is a 56 y.o. male.  HPI    Star Age, MD, PhD [facility] [address]O. [place], [place] [address]  Dear Benjamine Mola,  I saw your patient, Steven Giles, upon your kind request in my sleep clinic today for initial consultation of his sleep disorder, in particular, evaluation of his prior diagnosis of obstructive sleep apnea.  The patient is unaccompanied today.  As you know, Steven Giles is a 56 year old right-handed gentleman with an underlying medical history of diabetes, hypertension, gout, anxiety, depression, and obesity, who was previously diagnosed with obstructive sleep apnea and placed on positive airway pressure treatment.  Prior sleep study results are not available for my review today.  I reviewed your office note from 12/22/2020.  His Epworth sleepiness score is 6 out of 24, fatigue severity score is 45 out of 63.  He is not aware of any family history of sleep apnea but suspects that his mom may have had it.  He has a variable bedtime, he currently works from 6 AM to 2 PM but is starting a new job with the [employer].  He currently works for [employer].  He lives with his wife, they have 1 cat in the household, he does have a TV in the bedroom but does not actually use it.  Their daughter lives next door.  Current bedtime is generally around 9:30 PM and rise time around 4 AM but this may change with his new job.  He does not have night to night nocturia or recurrent morning headaches.  He has had some weight gain over time.  Compared to when he first got diagnosed with sleep apnea, he estimates that he is about 30 pounds heavier.  He drinks quite a bit of caffeine in the form of diet soda, about 5-6 20 ounce per day.  Drinks alcohol occasionally to rarely, about once a month. A CPAP compliance but was not available, as the patient did not bring his machine, he also reports that his machine is the  original machine from about 20 years ago. His DME company is [employer].  We will try to get sleep study results and a download from [facility].  As far as he knows his pressure is at 10 cm.  He has had trouble with his machine not always turning on correctly, the power button does not seem to work as well as it used to.  His Past Medical History Is Significant For: Past Medical History:  Diagnosis Date   Diabetes mellitus without complication ([place])    Gout    H/O exercise stress test 2012   normal   Hypertension    Obstructive sleep apnea on CPAP    Sleep apnea     His Past Surgical History Is Significant For: Past Surgical History:  Procedure Laterality Date   NO PAST SURGERIES      His Family History Is Significant For: Family History  Problem Relation Age of Onset   Heart failure Father        mini-stroke?   Heart attack Father    Diabetes Maternal Grandmother    Heart Problems Maternal Grandmother    Heart failure Maternal Grandmother    Healthy Mother    Healthy Brother    Heart attack Maternal Grandfather    Stroke Maternal Grandfather    Colon cancer Neg Hx    Colon polyps Neg Hx    Esophageal  cancer Neg Hx    Rectal cancer Neg Hx    Stomach cancer Neg Hx     His Social History Is Significant For: Social History   Socioeconomic History   Marital status: Married    Spouse name: Not on file   Number of children: 0   Years of education: 12   Highest education level: Not on file  Occupational History   Occupation: Dentist: OTHER    Comment: Banker  Tobacco Use   Smoking status: Never   Smokeless tobacco: Never  Vaping Use   Vaping Use: Never used  Substance and Sexual Activity   Alcohol use: Yes    Alcohol/week: 3.0 standard drinks    Types: 3 Cans of beer per week   Drug use: No   Sexual activity: Not on file  Other Topics Concern   Not on file  Social History Narrative   Left handed   Caffeine- 5 -6 cups per day     Social Determinants of Health   Financial Resource Strain: Not on file  Food Insecurity: Not on file  Transportation Needs: Not on file  Physical Activity: Not on file  Stress: Not on file  Social Connections: Not on file    His Allergies Are:  Allergies  Allergen Reactions   Nasacort [Triamcinolone] Hives    Nasacort   :   His Current Medications Are:  Outpatient Encounter Medications as of 08/03/2021  Medication Sig   febuxostat (ULORIC) 40 MG tablet Take 40 mg by mouth daily.   metFORMIN (GLUCOPHAGE) 500 MG tablet Take 500 mg by mouth 2 (two) times daily.   olmesartan (BENICAR) 40 MG tablet Take 40 mg by mouth daily.   rosuvastatin (CRESTOR) 10 MG tablet Take 10 mg by mouth daily.   Semaglutide, 1 MG/DOSE, (OZEMPIC, 1 MG/DOSE,) 2 MG/1.5ML SOPN Inject into the skin.   [DISCONTINUED] acetaminophen (TYLENOL) 500 MG tablet Take 1,000 mg by mouth every 6 (six) hours as needed for fever.   [DISCONTINUED] Ascorbic Acid (VITAMIN C) 1000 MG tablet Take 500 mg by mouth daily.   [DISCONTINUED] cholecalciferol (VITAMIN D3) 25 MCG (1000 UT) tablet Take 1,000 Units by mouth daily.   [DISCONTINUED] dexamethasone (DECADRON) 2 MG tablet Take 3 tablets once daily for 3 days, then take 2 tablets once daily for 3 days, then 1 tablet once daily for 3 days, then STOP.   [DISCONTINUED] famotidine (PEPCID) 20 MG tablet Take 1 tablet (20 mg total) by mouth daily for 10 days.   [DISCONTINUED] Ipratropium-Albuterol (COMBIVENT) 20-100 MCG/ACT AERS respimat Inhale 1 puff into the lungs every 6 (six) hours as needed for wheezing.   [DISCONTINUED] lisinopril-hydrochlorothiazide (ZESTORETIC) 10-12.5 MG tablet Take 1 tablet by mouth daily.   [DISCONTINUED] Multiple Vitamins-Minerals (MULTIVITAMIN WITH MINERALS) tablet Take 1 tablet by mouth daily.   [DISCONTINUED] zinc gluconate 50 MG tablet Take 50 mg by mouth daily.   No facility-administered encounter medications on file as of 08/03/2021.  :   Review  of Systems:  Out of a complete 14 point review of systems, all are reviewed and negative with the exception of these symptoms as listed below:  Review of Systems  Neurological:        Here for sleep consult pt reports he was dx with CPAP 20 years ago was placed on CPAP. Pt reports he has been on the same cpap for the past 20 years and would like to discuss getting a repeat sleep study and new  cpap.   Objective:  Neurological Exam  Physical Exam Physical Examination:   Vitals:   08/03/21 0857  BP: (!) 142/94  Pulse: 68    General Examination: The patient is a very pleasant 56 y.o. male in no acute distress. He appears well-developed and well-nourished and well groomed.   HEENT: Normocephalic, atraumatic, pupils are equal, round and reactive to light, extraocular tracking is good without limitation to gaze excursion or nystagmus noted. Hearing is grossly intact. Face is symmetric with normal facial animation. Speech is clear with no dysarthria noted. There is no hypophonia. There is no lip, neck/head, jaw or voice tremor. Neck is supple with full range of passive and active motion. There are no carotid bruits on auscultation. Oropharynx exam reveals: mild mouth dryness, adequate dental hygiene and moderate airway crowding, due to small airway entry and redundant soft palate, tonsils on the smaller side, Mallampati class III, neck circumference of 17 3/8 inches.  He has a minimal overbite. Tongue protrudes centrally and palate elevates symmetrically.  Chest: Clear to auscultation without wheezing, rhonchi or crackles noted.  Heart: S1+S2+0, regular and normal without murmurs, rubs or gallops noted.   Abdomen: Soft, non-tender and non-distended.  Extremities: There is no pitting edema in the distal lower extremities bilaterally.   Skin: Warm and dry without trophic changes noted.   Musculoskeletal: exam reveals no obvious joint deformities.   Neurologically:  Mental status: The  patient is awake, alert and oriented in all 4 spheres. His immediate and remote memory, attention, language skills and fund of knowledge are appropriate. There is no evidence of aphasia, agnosia, apraxia or anomia. Speech is clear with normal prosody and enunciation. Thought process is linear. Mood is normal and affect is normal.  Cranial nerves II - XII are as described above under HEENT exam.  Motor exam: Normal bulk, strength and tone is noted. There is no tremor, Romberg is negative. Reflexes are 2+ throughout. Fine motor skills and coordination: grossly intact.  Cerebellar testing: No dysmetria or intention tremor. There is no truncal or gait ataxia.  Sensory exam: intact to light touch in the upper and lower extremities.  Gait, station and balance: He stands easily. No veering to one side is noted. No leaning to one side is noted. Posture is age-appropriate and stance is narrow based. Gait shows normal stride length and normal pace. No problems turning are noted.   Assessment and Plan:   In summary, Steven Giles is a very pleasant 56 y.o.-year old male with an underlying medical history of diabetes, hypertension, gout, anxiety, depression, and obesity, who presents for evaluation of his obstructive sleep apnea.  He was originally diagnosed some 20 years ago by his recollection.  He has been using his original CPAP machine which is obviously older.  We will proceed with reevaluation with a sleep study.  He should be eligible for a new machine afterwards.  I had a long chat with the patient about my findings and the diagnosis of OSA, its prognosis and treatment options. We talked about medical treatments, surgical interventions and non-pharmacological approaches. I explained in particular the risks and ramifications of untreated moderate to severe OSA, especially with respect to developing cardiovascular disease down the Road, including congestive heart failure, difficult to treat hypertension,  cardiac arrhythmias, or stroke. Even type 2 diabetes has, in part, been linked to untreated OSA. Symptoms of untreated OSA include daytime sleepiness, memory problems, mood irritability and mood disorder such as depression and anxiety, lack of energy,  as well as recurrent headaches, especially morning headaches. We talked about trying to maintain a healthy lifestyle in general, as well as the importance of weight control. We also talked about the importance of good sleep hygiene. I recommended the following at this time: sleep study.  I outlined the differences between a laboratory attended sleep study versus home sleep test. I explained the sleep test procedure to the patient and also outlined possible surgical and non-surgical treatment options of OSA, including the use of a custom-made dental device (which would require a referral to a specialist dentist or oral surgeon), upper airway surgical options, such as traditional UPPP or a novel less invasive surgical option in the form of Inspire hypoglossal nerve stimulation (which would involve a referral to an ENT surgeon). He would be willing to continue with PAP therapy, as he has done well with it over time.  He reports full compliance.  I explained the importance of being compliant with PAP treatment, not only for insurance purposes but primarily to improve His symptoms, and for the patient's long term health benefit, including to reduce His cardiovascular risks. I answered all his questions today and the patient was in agreement. I plan to see him back after the sleep study is completed and encouraged him to call with any interim questions, concerns, problems or updates.   Thank you very much for allowing me to participate in the care of this nice patient. If I can be of any further assistance to you please do not hesitate to call me at [phone].  Sincerely,   Star Age, MD, PhD

## 2021-08-03 NOTE — Patient Instructions (Addendum)
Thank you for choosing [facility] for your sleep related care! It was nice to meet you today! I appreciate that you entrust me with your sleep related healthcare concerns. I hope, I was able to address at least some of your concerns today, and that I can help you feel reassured and also get better.    Here is what we discussed today and what we came up with as our plan for you:    Based on your symptoms and your exam I believe you are still at risk for obstructive sleep apnea and would benefit from reevaluation as it has been many years and you need new supplies and an updated machine. We will ask [employer] if they have records on your old sleep study.   We should proceed with a sleep study to determine how severe your sleep apnea is. If you have more than mild OSA, I want you to consider ongoing treatment with CPAP. Please remember, the risks and ramifications of moderate to severe obstructive sleep apnea or OSA are: Cardiovascular disease, including congestive heart failure, stroke, difficult to control hypertension, arrhythmias, and even type 2 diabetes has been linked to untreated OSA. Sleep apnea causes disruption of sleep and sleep deprivation in most cases, which, in turn, can cause recurrent headaches, problems with memory, mood, concentration, focus, and vigilance. Most people with untreated sleep apnea report excessive daytime sleepiness, which can affect their ability to drive. Please do not drive if you feel sleepy.   I will likely see you back after your sleep study to go over the test results and where to go from there. We will call you after your sleep study to advise about the results (most likely, you will hear from [place], my nurse) and to set up an appointment at the time, as necessary.    Our sleep lab administrative assistant will call you to schedule your sleep study. If you don't hear back from her by about 2 weeks from now, please feel free to call her at  [phone]. You can leave a message with your phone number and concerns, if you get the voicemail box. She will call back as soon as possible.

## 2021-08-17 ENCOUNTER — Telehealth: Payer: Self-pay

## 2021-08-17 NOTE — Telephone Encounter (Signed)
LVM for pt to call me back to schedule sleep study  

## 2021-08-18 DIAGNOSIS — R051 Acute cough: Secondary | ICD-10-CM | POA: Diagnosis not present

## 2021-08-18 DIAGNOSIS — J209 Acute bronchitis, unspecified: Secondary | ICD-10-CM | POA: Diagnosis not present

## 2021-08-22 ENCOUNTER — Telehealth: Payer: Self-pay

## 2021-08-22 NOTE — Telephone Encounter (Signed)
LVM for pt to call me back to schedule sleep study  

## 2021-08-24 ENCOUNTER — Telehealth: Payer: Self-pay

## 2021-09-01 NOTE — Telephone Encounter (Signed)
We have attempted to call the patient 2 times to schedule sleep study. Patient has been unavailable at the phone numbers we have on file and has not returned our calls. If patient calls back we will schedule them for their sleep study. ° °

## 2021-09-14 DIAGNOSIS — R2231 Localized swelling, mass and lump, right upper limb: Secondary | ICD-10-CM | POA: Diagnosis not present

## 2021-09-14 DIAGNOSIS — M79631 Pain in right forearm: Secondary | ICD-10-CM | POA: Diagnosis not present

## 2021-09-20 ENCOUNTER — Telehealth: Payer: Self-pay

## 2021-09-20 NOTE — Telephone Encounter (Signed)
Returned pt's call and LVM for pt to call me back to schedule sleep study ° °

## 2021-09-28 ENCOUNTER — Ambulatory Visit (INDEPENDENT_AMBULATORY_CARE_PROVIDER_SITE_OTHER): Payer: Federal, State, Local not specified - PPO | Admitting: Neurology

## 2021-09-28 DIAGNOSIS — E782 Mixed hyperlipidemia: Secondary | ICD-10-CM | POA: Diagnosis not present

## 2021-09-28 DIAGNOSIS — G4733 Obstructive sleep apnea (adult) (pediatric): Secondary | ICD-10-CM

## 2021-09-28 DIAGNOSIS — I10 Essential (primary) hypertension: Secondary | ICD-10-CM | POA: Diagnosis not present

## 2021-09-28 DIAGNOSIS — R635 Abnormal weight gain: Secondary | ICD-10-CM

## 2021-09-28 DIAGNOSIS — E1165 Type 2 diabetes mellitus with hyperglycemia: Secondary | ICD-10-CM | POA: Diagnosis not present

## 2021-09-28 DIAGNOSIS — E669 Obesity, unspecified: Secondary | ICD-10-CM

## 2021-10-04 NOTE — Addendum Note (Signed)
Addended by: Star Age on: 10/04/2021 06:21 PM ? ? Modules accepted: Orders ? ?

## 2021-10-04 NOTE — Progress Notes (Signed)
See procedure note.

## 2021-10-04 NOTE — Procedures (Signed)
? ?  [facility] ? ?HOME SLEEP TEST (Watch PAT) REPORT ? ?STUDY DATE: 09/28/2021 ? ?DOB: [birth year] ? ?MRN: [id number] ? ?ORDERING CLINICIAN: Star Age, MD, PhD ?  ?REFERRING CLINICIAN: Fanny Bien, MD  ? ?CLINICAL INFORMATION/HISTORY: 56 year old right-handed gentleman with an underlying medical history of diabetes, hypertension, gout, anxiety, depression, and obesity, who was previously diagnosed with obstructive sleep apnea and placed on positive airway pressure treatment.  He presents for reevaluation as he has not had sleep testing in many years and has a rather old CPAP machine. ? ?Epworth sleepiness score: 6/24. ? ?BMI: 36.9 kg/m? ? ?FINDINGS:  ? ?Sleep Summary:  ? ?Total Recording Time (hours, min): 7 hours, 9 minutes ? ?Total Sleep Time (hours, min):  5 hours, 13 minutes  ? ?Percent REM (%):    8.6%  ? ?Respiratory Indices:  ? ?Calculated pAHI (per hour):  49.1/hour        ? ?REM pAHI:    n/a      ? ?Oxygen Saturation Statistics:  ?  ?Oxygen Saturation (%) Mean: 93%  ? ?Minimum oxygen saturation (%):                 88%  ? ?O2 Saturation Range (%): 88-99%   ? ?O2 Saturation (minutes) <=88%: 0.2 min ? ?Pulse Rate Statistics:  ? ?Pulse Mean (bpm):    68/min   ? ?Pulse Range (49-109/min)  ? ?IMPRESSION: OSA (obstructive sleep apnea)  ? ?RECOMMENDATION:  ?This home sleep test demonstrates severe obstructive sleep apnea - by number of events - with a total AHI of 49.1/hour and O2 nadir of 88%.  Snoring ranged from mild to loud.  Ongoing treatment with positive airway pressure is recommended.  I recommend starting treatment with a new AutoPap machine.  The patient has older equipment which is no longer working properly.  He should be eligible for new equipment. A laboratory attended titration study can be considered in the future for optimization of his treatment and better tolerance of therapy.  Alternative treatment options are limited secondary to the severity of the patient's sleep  disordered breathing.  Concomitant weight loss is recommended.  Please note, that untreated obstructive sleep apnea may carry additional perioperative morbidity. Patients with significant obstructive sleep apnea should receive perioperative PAP therapy and the surgeons and particularly the anesthesiologist should be informed of the diagnosis and the severity of the sleep disordered breathing. ?The patient should be cautioned not to drive, work at heights, or operate dangerous or heavy equipment when tired or sleepy. Review and reiteration of good sleep hygiene measures should be pursued with any patient. ?Other causes of the patient's symptoms, including circadian rhythm disturbances, an underlying mood disorder, medication effect and/or an underlying medical problem cannot be ruled out based on this test. Clinical correlation is recommended. The patient and his referring provider will be notified of the test results. The patient will be seen in follow up in sleep clinic at [facility]. ? ?I certify that I have reviewed the raw data recording prior to the issuance of this report in accordance with the standards of the American Academy of Sleep Medicine (AASM). ? ?INTERPRETING PHYSICIAN:  ? ?Star Age, MD, PhD  ?Board Certified in Neurology and Sleep Medicine ? ?[facility] ?[address], [address] ?[place],  [address] ?[phone] ? ? ? ? ? ? ? ? ? ? ? ? ? ? ? ? ? ?

## 2021-10-06 ENCOUNTER — Encounter: Payer: Self-pay | Admitting: *Deleted

## 2021-10-06 ENCOUNTER — Telehealth: Payer: Self-pay | Admitting: *Deleted

## 2021-10-06 NOTE — Telephone Encounter (Signed)
Called pt & LVM with office number asking for call back.  

## 2021-10-06 NOTE — Telephone Encounter (Signed)
-----   Message from Star Age, MD sent at 10/04/2021  6:21 PM EDT ----- ?Patient referred by Dr. Ernie Hew for reevaluation of his OSA, seen by me on 08/03/2021, HST on 09/28/2021.   ? ?Please call and notify the patient that the recent home sleep test showed obstructive sleep apnea in the severe range. I recommend ongoing treatment with a CPAP or similar machine, I will write for an AutoPap machine for now.  It is similar to a CPAP with a range of pressures, we will likely keep the pressure close to 10 cm which as he reported is his current treatment pressure on his CPAP.  I do not recall if he would like to stay with his current DME company.  We can send the order to a new company as per his preference.   ?Please also reinforce the need for compliance with treatment. We will need a FU in sleep clinic for 10 weeks post-PAP set up, please arrange that with me or one of our NPs. Thanks,  ? ?Star Age, MD, PhD ?[facility]) ? ? ? ? ?

## 2021-10-06 NOTE — Telephone Encounter (Signed)
I called pt. I advised pt that Dr. Rexene Alberts reviewed their sleep study results and found that pt has osa SEVERE . Dr. Rexene Alberts recommends that pt start autopap. I reviewed PAP compliance expectations with the pt. Pt is agreeable to starting a CPAP. I advised pt that an order will be sent to a DME, [facility], and they will call the pt within about one week after they file with the pt's insurance. [facility] will show the pt how to use the machine, fit for masks, and troubleshoot the CPAP if needed. A follow up appt was made for insurance purposes with Dr. Rexene Alberts on 12-20-2021 at [place]. Pt verbalized understanding to arrive 15 minutes early and bring their CPAP. A letter with all of this information in it will be mailed to the pt as a reminder. I verified with the pt that the address we have on file is correct. Pt verbalized understanding of results. Pt had no questions at this time but was encouraged to call back if questions arise. I have sent the order to [facility] and have received confirmation that they have received the order.  ?

## 2021-10-06 NOTE — Telephone Encounter (Signed)
-----   Message from Star Age, MD sent at 10/04/2021  6:21 PM EDT ----- ?Patient referred by Dr. Ernie Hew for reevaluation of his OSA, seen by me on 08/03/2021, HST on 09/28/2021.   ? ?Please call and notify the patient that the recent home sleep test showed obstructive sleep apnea in the severe range. I recommend ongoing treatment with a CPAP or similar machine, I will write for an AutoPap machine for now.  It is similar to a CPAP with a range of pressures, we will likely keep the pressure close to 10 cm which as he reported is his current treatment pressure on his CPAP.  I do not recall if he would like to stay with his current DME company.  We can send the order to a new company as per his preference.   ?Please also reinforce the need for compliance with treatment. We will need a FU in sleep clinic for 10 weeks post-PAP set up, please arrange that with me or one of our NPs. Thanks,  ? ?Star Age, MD, PhD ?[facility]) ? ? ? ? ?

## 2021-10-08 DIAGNOSIS — M85462 Solitary bone cyst, left tibia and fibula: Secondary | ICD-10-CM | POA: Diagnosis not present

## 2021-10-13 DIAGNOSIS — M7989 Other specified soft tissue disorders: Secondary | ICD-10-CM | POA: Diagnosis not present

## 2021-10-13 DIAGNOSIS — R2243 Localized swelling, mass and lump, lower limb, bilateral: Secondary | ICD-10-CM | POA: Diagnosis not present

## 2021-10-13 DIAGNOSIS — R2242 Localized swelling, mass and lump, left lower limb: Secondary | ICD-10-CM | POA: Diagnosis not present

## 2021-10-21 DIAGNOSIS — M79605 Pain in left leg: Secondary | ICD-10-CM | POA: Diagnosis not present

## 2021-10-27 DIAGNOSIS — G4733 Obstructive sleep apnea (adult) (pediatric): Secondary | ICD-10-CM | POA: Diagnosis not present

## 2021-11-03 DIAGNOSIS — L729 Follicular cyst of the skin and subcutaneous tissue, unspecified: Secondary | ICD-10-CM | POA: Diagnosis not present

## 2021-11-10 DIAGNOSIS — M79605 Pain in left leg: Secondary | ICD-10-CM | POA: Diagnosis not present

## 2021-11-27 DIAGNOSIS — G4733 Obstructive sleep apnea (adult) (pediatric): Secondary | ICD-10-CM | POA: Diagnosis not present

## 2021-12-06 DIAGNOSIS — M79605 Pain in left leg: Secondary | ICD-10-CM | POA: Diagnosis not present

## 2021-12-06 DIAGNOSIS — M67462 Ganglion, left knee: Secondary | ICD-10-CM | POA: Diagnosis not present

## 2021-12-20 ENCOUNTER — Ambulatory Visit: Payer: Federal, State, Local not specified - PPO | Admitting: Neurology

## 2021-12-20 ENCOUNTER — Encounter: Payer: Self-pay | Admitting: Neurology

## 2021-12-20 VITALS — BP 136/91 | HR 71 | Ht 69.0 in | Wt 236.0 lb

## 2021-12-20 DIAGNOSIS — Z9989 Dependence on other enabling machines and devices: Secondary | ICD-10-CM

## 2021-12-20 DIAGNOSIS — G4733 Obstructive sleep apnea (adult) (pediatric): Secondary | ICD-10-CM | POA: Diagnosis not present

## 2021-12-20 NOTE — Progress Notes (Signed)
Subjective:    Patient ID: Steven Giles is a 56 y.o. male.  HPI    Interim history:   Mr. Davtyan is a 56 year old right-handed gentleman with an underlying medical history of diabetes, hypertension, gout, anxiety, depression, and obesity, who presents for follow-up consultation of his obstructive sleep apnea after interim reevaluation with home sleep testing and starting therapy with a new AutoPap machine.  The patient is unaccompanied today.  I first met him at the request of his primary care physician on 08/03/2021, at which time he reported a prior diagnosis of obstructive sleep apnea.  He had an old CPAP machine.  He was advised to proceed with re-evaluation.   He had a home sleep test on 09/28/21, which showed severe obstructive sleep apnea - by number of events - with a total AHI of 49.1/hour and O2 nadir of 88%.  Snoring ranged from mild to loud.  Continue AutoPap machine.  His set up date was 10/24/2021, he has a ResMed air sense 11 AutoSet machine.  Today, 12/20/2021: I reviewed his AutoPap compliance data from 11/19/2021 through 12/18/2021, which is a total of 30 days, during which time he used his machine every night with percent use days greater than 4 hours at 100%, indicating superb compliance, average usage of 7 hours and 27 minutes, residual AHI at goal at 1.7/h, average pressure for the 95th percentile at 8.6 cm with a range of 6 to 12 cm, leak on the lower side with the 95th percentile at 4.8 L/min.  He reports doing well with his new machine, he benefits from treatment and is fully compliant with it.  He uses a nasal mask.  He has had no recent changes to his medical history, Ozempic is working well and has reduced his A1c.  He has had some weight loss as well but attributes this in part to the stress of a new job.  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  The patient's allergies, current  medications, family history, past medical history, past social history, past surgical history and problem list were reviewed and updated as appropriate.   Previously:   08/03/21: (He) was previously diagnosed with obstructive sleep apnea and placed on positive airway pressure treatment.  Prior sleep study results are not available for my review today.  I reviewed your office note from 12/22/2020.  His Epworth sleepiness score is 6 out of 24, fatigue severity score is 45 out of 63.  He is not aware of any family history of sleep apnea but suspects that his mom may have had it.  He has a variable bedtime, he currently works from 6 AM to 2 PM but is starting a new job with the [employer].  He currently works for [employer].  He lives with his wife, they have 1 cat in the household, he does have a TV in the bedroom but does not actually use it.  Their daughter lives next door.  Current bedtime is generally around 9:30 PM and rise time around 4 AM but this may change with his new job.  He does not have night to night nocturia or recurrent morning headaches.  He has had some weight gain over time.  Compared to when he first got diagnosed with sleep apnea, he estimates that he is about 30 pounds heavier.  He drinks quite a bit of caffeine  in the form of diet soda, about 5-6 20 ounce per day.  Drinks alcohol occasionally to rarely, about once a month. A CPAP compliance was not available, as the patient did not bring his machine, he also reports that his machine is the original machine from about 20 years ago. His DME company is [employer].  We will try to get sleep study results and a download from [facility].  As far as he knows his pressure is at 10 cm.  He has had trouble with his machine not always turning on correctly, the power button does not seem to work as well as it used to.     His Past Medical History Is Significant For: Past Medical History:  Diagnosis Date   Diabetes mellitus without complication  ([place])    Gout    H/O exercise stress test 2012   normal   Hypertension    Obstructive sleep apnea on CPAP    Sleep apnea     His Past Surgical History Is Significant For: Past Surgical History:  Procedure Laterality Date   NO PAST SURGERIES      His Family History Is Significant For: Family History  Problem Relation Age of Onset   Heart failure Father        mini-stroke?   Heart attack Father    Diabetes Maternal Grandmother    Heart Problems Maternal Grandmother    Heart failure Maternal Grandmother    Healthy Mother    Healthy Brother    Heart attack Maternal Grandfather    Stroke Maternal Grandfather    Colon cancer Neg Hx    Colon polyps Neg Hx    Esophageal cancer Neg Hx    Rectal cancer Neg Hx    Stomach cancer Neg Hx     His Social History Is Significant For: Social History   Socioeconomic History   Marital status: Married    Spouse name: Not on file   Number of children: 0   Years of education: 12   Highest education level: Not on file  Occupational History   Occupation: Dentist: OTHER    Comment: Banker  Tobacco Use   Smoking status: Never   Smokeless tobacco: Never  Vaping Use   Vaping Use: Never used  Substance and Sexual Activity   Alcohol use: Yes    Alcohol/week: 3.0 standard drinks of alcohol    Types: 3 Cans of beer per week   Drug use: No   Sexual activity: Not on file  Other Topics Concern   Not on file  Social History Narrative   Left handed   Caffeine- 5 -6 cups per day    Social Determinants of Health   Financial Resource Strain: Not on file  Food Insecurity: Not on file  Transportation Needs: Not on file  Physical Activity: Not on file  Stress: Not on file  Social Connections: Not on file    His Allergies Are:  Allergies  Allergen Reactions   Nasacort [Triamcinolone] Hives    Nasacort   :   His Current Medications Are:  Outpatient Encounter Medications as of 12/20/2021  Medication Sig    febuxostat (ULORIC) 40 MG tablet Take 40 mg by mouth daily.   metFORMIN (GLUCOPHAGE) 500 MG tablet Take 500 mg by mouth 2 (two) times daily.   olmesartan (BENICAR) 40 MG tablet Take 40 mg by mouth daily.   rosuvastatin (CRESTOR) 10 MG tablet Take 10 mg by mouth daily.  Semaglutide, 1 MG/DOSE, (OZEMPIC, 1 MG/DOSE,) 2 MG/1.5ML SOPN Inject into the skin.   No facility-administered encounter medications on file as of 12/20/2021.  :  Review of Systems:  Out of a complete 14 point review of systems, all are reviewed and negative with the exception of these symptoms as listed below:  Review of Systems  Neurological:        Initial f/u for cpap. Set up date 10-27-2021.  Doing well.      Objective:  Neurological Exam  Physical Exam Physical Examination:   Vitals:   12/20/21 0726  BP: (!) 136/91  Pulse: 71    General Examination: The patient is a very pleasant 56 y.o. male in no acute distress. He appears well-developed and well-nourished and well groomed.   HEENT: Normocephalic, atraumatic, pupils are equal, round and reactive to light, tracking is well-preserved, corrective eyeglasses in place.  Hearing is grossly intact, face is symmetric with normal facial animation, speech is clear without dysarthria, hypophonia or voice tremor.  Neck with full range of motion, no carotid bruits.  Airway examination reveals very mild mouth dryness, otherwise stable findings with moderate airway crowding noted.  Tongue protrudes centrally and palate elevates symmetrically.    Chest: Clear to auscultation without wheezing, rhonchi or crackles noted.   Heart: S1+S2+0, regular and normal without murmurs, rubs or gallops noted.    Abdomen: Soft, non-tender and non-distended.   Extremities: There is no obvious edema in the distal lower extremities bilaterally.    Skin: Warm and dry without trophic changes noted.    Musculoskeletal: exam reveals no obvious joint deformities.    Neurologically:   Mental status: The patient is awake, alert and oriented in all 4 spheres. His immediate and remote memory, attention, language skills and fund of knowledge are appropriate. There is no evidence of aphasia, agnosia, apraxia or anomia. Speech is clear with normal prosody and enunciation. Thought process is linear. Mood is normal and affect is normal.  Cranial nerves II - XII are as described above under HEENT exam.  Motor exam: Normal bulk, strength and tone is noted. There is no obvious tremor.  Fine motor skills and coordination: grossly intact.  Cerebellar testing: No dysmetria or intention tremor. There is no truncal or gait ataxia.  Sensory exam: intact to light touch in the upper and lower extremities.  Gait, station and balance: He stands easily. No veering to one side is noted. No leaning to one side is noted. Posture is age-appropriate and stance is narrow based. Gait shows normal stride length and normal pace. No problems turning are noted.    Assessment and Plan:    In summary, DEJA KAIGLER is a very pleasant 56 year old male with an underlying medical history of diabetes, hypertension, gout, anxiety, depression, and obesity, who presents for follow-up consultation of his obstructive sleep apnea.  He carries a prior diagnosis of obstructive sleep apnea for years, we proceeded with a reevaluation via home sleep testing on 09/28/2021 which indicated an AHI of 49.1/h, O2 nadir 88%.  He established treatment with a new AutoPap machine on 10/27/2021 and is fully compliant with treatment.  He is doing well at this time.  He is commended for his treatment adherence.  We need his compliance data.  He will.  He is advised to follow-up with [facility] routinely in 1 year to see one of our nurse practitioners, we can also offer him a video visit if need be.  I advised him to be mindful  of changing his PAP supplies on regular basis, including changing the filter monthly and humidifier chamber yearly, tubing  biannually. I answered all his questions today and he was in agreement.   I spent 30 minutes in total face-to-face time and in reviewing records during pre-charting, more than 50% of which was spent in counseling and coordination of care, reviewing test results, reviewing medications and treatment regimen and/or in discussing or reviewing the diagnosis of OSA, the prognosis and treatment options. Pertinent laboratory and imaging test results that were available during this visit with the patient were reviewed by me and considered in my medical decision making (see chart for details).

## 2021-12-20 NOTE — Patient Instructions (Addendum)
It was nice to see you again today.   You are fully compliant with your new AutoPap machine, keep up the good work!    Please continue using your autoPAP regularly. While your insurance requires that you use PAP at least 4 hours each night on 70% of the nights, I recommend, that you not skip any nights and use it throughout the night if you can. Getting used to PAP and staying with the treatment long term does take time and patience and discipline. Untreated obstructive sleep apnea when it is moderate to severe can have an adverse impact on cardiovascular health and raise her risk for heart disease, arrhythmias, hypertension, congestive heart failure, stroke and diabetes. Untreated obstructive sleep apnea causes sleep disruption, nonrestorative sleep, and sleep deprivation. This can have an impact on your day to day functioning and cause daytime sleepiness and impairment of cognitive function, memory loss, mood disturbance, and problems focussing. Using PAP regularly can improve these symptoms.  Please follow-up routinely for sleep apnea management to see one of our nurse practitioners in 1 year, we can offer you a video visit via MyChart if you prefer.

## 2021-12-27 DIAGNOSIS — G4733 Obstructive sleep apnea (adult) (pediatric): Secondary | ICD-10-CM | POA: Diagnosis not present

## 2022-01-12 DIAGNOSIS — E1165 Type 2 diabetes mellitus with hyperglycemia: Secondary | ICD-10-CM | POA: Diagnosis not present

## 2022-01-16 DIAGNOSIS — I10 Essential (primary) hypertension: Secondary | ICD-10-CM | POA: Diagnosis not present

## 2022-01-16 DIAGNOSIS — E1165 Type 2 diabetes mellitus with hyperglycemia: Secondary | ICD-10-CM | POA: Diagnosis not present

## 2022-01-16 DIAGNOSIS — M109 Gout, unspecified: Secondary | ICD-10-CM | POA: Diagnosis not present

## 2022-01-16 DIAGNOSIS — E669 Obesity, unspecified: Secondary | ICD-10-CM | POA: Diagnosis not present

## 2022-01-27 DIAGNOSIS — G4733 Obstructive sleep apnea (adult) (pediatric): Secondary | ICD-10-CM | POA: Diagnosis not present

## 2022-05-09 DIAGNOSIS — Z0000 Encounter for general adult medical examination without abnormal findings: Secondary | ICD-10-CM | POA: Diagnosis not present

## 2022-05-09 DIAGNOSIS — Z125 Encounter for screening for malignant neoplasm of prostate: Secondary | ICD-10-CM | POA: Diagnosis not present

## 2022-05-09 DIAGNOSIS — Z114 Encounter for screening for human immunodeficiency virus [HIV]: Secondary | ICD-10-CM | POA: Diagnosis not present

## 2022-05-15 ENCOUNTER — Encounter: Payer: Self-pay | Admitting: Neurology

## 2022-05-16 DIAGNOSIS — Z23 Encounter for immunization: Secondary | ICD-10-CM | POA: Diagnosis not present

## 2022-05-16 DIAGNOSIS — H6123 Impacted cerumen, bilateral: Secondary | ICD-10-CM | POA: Diagnosis not present

## 2022-05-16 DIAGNOSIS — Z0000 Encounter for general adult medical examination without abnormal findings: Secondary | ICD-10-CM | POA: Diagnosis not present

## 2022-08-07 DIAGNOSIS — E1165 Type 2 diabetes mellitus with hyperglycemia: Secondary | ICD-10-CM | POA: Diagnosis not present

## 2022-08-07 DIAGNOSIS — E559 Vitamin D deficiency, unspecified: Secondary | ICD-10-CM | POA: Diagnosis not present

## 2022-08-07 DIAGNOSIS — I10 Essential (primary) hypertension: Secondary | ICD-10-CM | POA: Diagnosis not present

## 2022-08-07 DIAGNOSIS — E782 Mixed hyperlipidemia: Secondary | ICD-10-CM | POA: Diagnosis not present

## 2022-08-11 DIAGNOSIS — I10 Essential (primary) hypertension: Secondary | ICD-10-CM | POA: Diagnosis not present

## 2022-08-11 DIAGNOSIS — E782 Mixed hyperlipidemia: Secondary | ICD-10-CM | POA: Diagnosis not present

## 2022-08-11 DIAGNOSIS — E1165 Type 2 diabetes mellitus with hyperglycemia: Secondary | ICD-10-CM | POA: Diagnosis not present

## 2022-08-11 DIAGNOSIS — E559 Vitamin D deficiency, unspecified: Secondary | ICD-10-CM | POA: Diagnosis not present

## 2022-08-31 DIAGNOSIS — B029 Zoster without complications: Secondary | ICD-10-CM | POA: Diagnosis not present

## 2022-11-12 DIAGNOSIS — R42 Dizziness and giddiness: Secondary | ICD-10-CM | POA: Diagnosis not present

## 2022-11-13 DIAGNOSIS — H6590 Unspecified nonsuppurative otitis media, unspecified ear: Secondary | ICD-10-CM | POA: Diagnosis not present

## 2022-11-13 DIAGNOSIS — H6120 Impacted cerumen, unspecified ear: Secondary | ICD-10-CM | POA: Diagnosis not present

## 2022-11-13 DIAGNOSIS — H9190 Unspecified hearing loss, unspecified ear: Secondary | ICD-10-CM | POA: Diagnosis not present

## 2022-11-13 DIAGNOSIS — J309 Allergic rhinitis, unspecified: Secondary | ICD-10-CM | POA: Diagnosis not present

## 2022-12-21 ENCOUNTER — Encounter: Payer: Self-pay | Admitting: Neurology

## 2022-12-21 ENCOUNTER — Telehealth: Payer: Self-pay

## 2022-12-21 ENCOUNTER — Ambulatory Visit: Payer: Federal, State, Local not specified - PPO | Admitting: Neurology

## 2022-12-21 VITALS — BP 129/82 | HR 79 | Ht 70.0 in | Wt 235.9 lb

## 2022-12-21 DIAGNOSIS — G4733 Obstructive sleep apnea (adult) (pediatric): Secondary | ICD-10-CM

## 2022-12-21 NOTE — Telephone Encounter (Signed)
Community msg sent via epic for cpap order per slack

## 2022-12-21 NOTE — Patient Instructions (Signed)
Continue superb CPAP compliance!  Continue to use nightly.  I will send an order to your DME for continued supplies and settings.  See you in 1 year.  Thanks!!

## 2022-12-21 NOTE — Telephone Encounter (Signed)
-----   Message from Glean Salvo sent at 12/21/2022  8:09 AM EDT ----- Please send order to DME for CPAP supplies.  Thanks

## 2022-12-21 NOTE — Progress Notes (Signed)
Patient: Steven Giles Date of Birth: 05-25-1966  Reason for Visit: Follow up History from: Patient Primary Neurologist: Frances Furbish   ASSESSMENT AND PLAN 57 y.o. year old male   1.  OSA on CPAP (long-term, reevaluation HST 09/28/2021 with AHI 49.1/h, O2 nadir 88%.  Started new AutoPap 10/27/2021)  -Has superb compliance, encouraged to continue nightly use for a minimum of 4 hours -Continue current settings, send supplies as needed, order sent to DME -Follow-up in 1 year or sooner if needed  Orders Placed This Encounter  Procedures   For home use only DME continuous positive airway pressure (CPAP)    HISTORY OF PRESENT ILLNESS: Today 12/21/22 Here for CPAP. Using nasal mask. Has not noted much subjective change in using CPAP. He was using an old CPAP machine for 20 years before he got the new one in May 2023. With his old machine he felt he got more pressure. [place] [employer] [place] ESS 6.  CPAP data 11/21/2022-12/20/22 shows superb compliance 100% daily with greater than 4-hour usage.  Average usage 6 hours 56 minutes. 6-12 centimeters water.  Leak 2.5.  AHI 1.0.  No issues with the machine or equipment.  HISTORY  12/20/21 Dr. Frances Furbish: Mr. Ferrando is a 57 year old right-handed gentleman with an underlying medical history of diabetes, hypertension, gout, anxiety, depression, and obesity, who presents for follow-up consultation of his obstructive sleep apnea after interim reevaluation with home sleep testing and starting therapy with a new AutoPap machine.  The patient is unaccompanied today.  I first met him at the request of his primary care physician on 08/03/2021, at which time he reported a prior diagnosis of obstructive sleep apnea.  He had an old CPAP machine.  He was advised to proceed with re-evaluation.   He had a home sleep test on 09/28/21, which showed severe obstructive sleep apnea - by number of events - with a total AHI of 49.1/hour and O2 nadir of  88%.  Snoring ranged from mild to loud.  Continue AutoPap machine.  His set up date was 10/24/2021, he has a ResMed air sense 11 AutoSet machine.   Today, 12/20/2021: I reviewed his AutoPap compliance data from 11/19/2021 through 12/18/2021, which is a total of 30 days, during which time he used his machine every night with percent use days greater than 4 hours at 100%, indicating superb compliance, average usage of 7 hours and 27 minutes, residual AHI at goal at 1.7/h, average pressure for the 95th percentile at 8.6 cm with a range of 6 to 12 cm, leak on the lower side with the 95th percentile at 4.8 L/min.  He reports doing well with his new machine, he benefits from treatment and is fully compliant with it.  He uses a nasal mask.  He has had no recent changes to his medical history, Ozempic is working well and has reduced his A1c.  He has had some weight loss as well but attributes this in part to the stress of a new job.  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  REVIEW OF SYSTEMS: Out of a complete 14 system review of symptoms, the patient complains only of the following symptoms, and all other reviewed systems are negative.  See HPI  ALLERGIES: Allergies  Allergen Reactions   Nasacort [Triamcinolone] Hives    Nasacort  HOME MEDICATIONS: Outpatient Medications Prior to Visit  Medication Sig Dispense Refill   febuxostat (ULORIC) 40 MG tablet Take 40 mg by mouth daily.     metFORMIN (GLUCOPHAGE) 500 MG tablet Take 500 mg by mouth 2 (two) times daily.     olmesartan (BENICAR) 40 MG tablet Take 40 mg by mouth daily.     rosuvastatin (CRESTOR) 10 MG tablet Take 10 mg by mouth daily.     Semaglutide, 1 MG/DOSE, (OZEMPIC, 1 MG/DOSE,) 2 MG/1.5ML SOPN Inject into the skin.     No facility-administered medications prior to visit.    PAST MEDICAL HISTORY: Past Medical History:  Diagnosis Date   Diabetes  mellitus without complication ([facility])    Gout    H/O exercise stress test 2012   normal   Hypertension    Obstructive sleep apnea on CPAP    Sleep apnea     PAST SURGICAL HISTORY: Past Surgical History:  Procedure Laterality Date   NO PAST SURGERIES      FAMILY HISTORY: Family History  Problem Relation Age of Onset   Heart failure Father        mini-stroke?   Heart attack Father    Diabetes Maternal Grandmother    Heart Problems Maternal Grandmother    Heart failure Maternal Grandmother    Healthy Mother    Healthy Brother    Heart attack Maternal Grandfather    Stroke Maternal Grandfather    Colon cancer Neg Hx    Colon polyps Neg Hx    Esophageal cancer Neg Hx    Rectal cancer Neg Hx    Stomach cancer Neg Hx     SOCIAL HISTORY: Social History   Socioeconomic History   Marital status: Married    Spouse name: Not on file   Number of children: 1   Years of education: 12   Highest education level: Not on file  Occupational History   Occupation: Research officer, trade union: OTHER    Comment: Civil engineer, contracting  Tobacco Use   Smoking status: Never   Smokeless tobacco: Never  Vaping Use   Vaping status: Never Used  Substance and Sexual Activity   Alcohol use: Yes    Alcohol/week: 3.0 standard drinks of alcohol    Types: 3 Cans of beer per week   Drug use: No   Sexual activity: Yes    Birth control/protection: None  Other Topics Concern   Not on file  Social History Narrative   Left handed   Caffeine- 5 -6 cups per day    Social Determinants of Health   Financial Resource Strain: Not on file  Food Insecurity: Not on file  Transportation Needs: Not on file  Physical Activity: Not on file  Stress: Not on file  Social Connections: Not on file  Intimate Partner Violence: Not on file   PHYSICAL EXAM  Vitals:   12/21/22 0744  BP: 129/82  Pulse: 79  Weight: 235 lb 14.3 oz (107 kg)  Height: 5\' 10"  (1.778 m)   Body mass index is 33.85  kg/m.  Generalized: Well developed, in no acute distress  Neurological examination  Mentation: Alert oriented to time, place, history taking. Follows all commands speech and language fluent Cranial nerve II-XII: Pupils were equal round reactive to light. Extraocular movements were full, visual field were full on confrontational test. Facial sensation and strength were normal.  Head turning and shoulder shrug  were normal and symmetric. Motor: The motor testing reveals 5 over 5  strength of all 4 extremities. Good symmetric motor tone is noted throughout.  Sensory: Sensory testing is intact to soft touch on all 4 extremities. No evidence of extinction is noted.  Gait and station: Gait is normal.    DIAGNOSTIC DATA (LABS, IMAGING, TESTING) - I reviewed patient records, labs, notes, testing and imaging myself where available.  Lab Results  Component Value Date   WBC 16.0 (H) 06/22/2019   HGB 13.9 06/22/2019   HCT 42.4 06/22/2019   MCV 91.4 06/22/2019   PLT 267 06/22/2019      Component Value Date/Time   NA 138 06/22/2019 0655   K 3.8 06/22/2019 0655   CL 104 06/22/2019 0655   CO2 23 06/22/2019 0655   GLUCOSE 184 (H) 06/22/2019 0655   BUN 31 (H) 06/22/2019 0655   CREATININE 0.95 06/22/2019 0655   CALCIUM 9.0 06/22/2019 0655   PROT 7.2 06/22/2019 0655   ALBUMIN 3.7 06/22/2019 0655   AST 31 06/22/2019 0655   ALT 37 06/22/2019 0655   ALKPHOS 54 06/22/2019 0655   BILITOT 1.0 06/22/2019 0655   GFRNONAA >60 06/22/2019 0655   GFRAA >60 06/22/2019 0655   Lab Results  Component Value Date   TRIG 193 (H) 06/19/2019   Lab Results  Component Value Date   HGBA1C 7.1 (H) 06/20/2019   No results found for: "VITAMINB12" No results found for: "TSH"  Margie Ege, AGNP-C, DNP 12/21/2022, 7:47 AM [facility] [address] [place], [place] [address] [phone]

## 2022-12-25 DIAGNOSIS — I10 Essential (primary) hypertension: Secondary | ICD-10-CM | POA: Diagnosis not present

## 2022-12-25 DIAGNOSIS — E1165 Type 2 diabetes mellitus with hyperglycemia: Secondary | ICD-10-CM | POA: Diagnosis not present

## 2022-12-25 DIAGNOSIS — M109 Gout, unspecified: Secondary | ICD-10-CM | POA: Diagnosis not present

## 2023-01-01 DIAGNOSIS — I10 Essential (primary) hypertension: Secondary | ICD-10-CM | POA: Diagnosis not present

## 2023-01-01 DIAGNOSIS — J309 Allergic rhinitis, unspecified: Secondary | ICD-10-CM | POA: Diagnosis not present

## 2023-01-01 DIAGNOSIS — E1165 Type 2 diabetes mellitus with hyperglycemia: Secondary | ICD-10-CM | POA: Diagnosis not present

## 2023-01-01 DIAGNOSIS — M109 Gout, unspecified: Secondary | ICD-10-CM | POA: Diagnosis not present

## 2023-02-23 DIAGNOSIS — R350 Frequency of micturition: Secondary | ICD-10-CM | POA: Diagnosis not present

## 2023-02-23 DIAGNOSIS — R112 Nausea with vomiting, unspecified: Secondary | ICD-10-CM | POA: Diagnosis not present

## 2023-02-23 DIAGNOSIS — Z23 Encounter for immunization: Secondary | ICD-10-CM | POA: Diagnosis not present

## 2023-02-23 DIAGNOSIS — R5383 Other fatigue: Secondary | ICD-10-CM | POA: Diagnosis not present

## 2023-02-23 DIAGNOSIS — R42 Dizziness and giddiness: Secondary | ICD-10-CM | POA: Diagnosis not present

## 2023-02-23 DIAGNOSIS — I10 Essential (primary) hypertension: Secondary | ICD-10-CM | POA: Diagnosis not present

## 2023-02-23 DIAGNOSIS — A084 Viral intestinal infection, unspecified: Secondary | ICD-10-CM | POA: Diagnosis not present

## 2023-02-26 DIAGNOSIS — A084 Viral intestinal infection, unspecified: Secondary | ICD-10-CM | POA: Diagnosis not present

## 2023-02-26 DIAGNOSIS — Z7185 Encounter for immunization safety counseling: Secondary | ICD-10-CM | POA: Diagnosis not present

## 2023-03-06 DIAGNOSIS — R600 Localized edema: Secondary | ICD-10-CM | POA: Diagnosis not present

## 2023-03-06 DIAGNOSIS — T63421A Toxic effect of venom of ants, accidental (unintentional), initial encounter: Secondary | ICD-10-CM | POA: Diagnosis not present

## 2023-03-06 DIAGNOSIS — E1165 Type 2 diabetes mellitus with hyperglycemia: Secondary | ICD-10-CM | POA: Diagnosis not present

## 2023-03-09 DIAGNOSIS — T63421A Toxic effect of venom of ants, accidental (unintentional), initial encounter: Secondary | ICD-10-CM | POA: Diagnosis not present

## 2023-03-09 DIAGNOSIS — R600 Localized edema: Secondary | ICD-10-CM | POA: Diagnosis not present

## 2023-04-06 DIAGNOSIS — E1165 Type 2 diabetes mellitus with hyperglycemia: Secondary | ICD-10-CM | POA: Diagnosis not present

## 2023-04-06 DIAGNOSIS — I10 Essential (primary) hypertension: Secondary | ICD-10-CM | POA: Diagnosis not present

## 2023-04-06 DIAGNOSIS — R5383 Other fatigue: Secondary | ICD-10-CM | POA: Diagnosis not present

## 2023-04-06 DIAGNOSIS — R600 Localized edema: Secondary | ICD-10-CM | POA: Diagnosis not present

## 2023-04-06 DIAGNOSIS — H6123 Impacted cerumen, bilateral: Secondary | ICD-10-CM | POA: Diagnosis not present

## 2023-04-06 DIAGNOSIS — E559 Vitamin D deficiency, unspecified: Secondary | ICD-10-CM | POA: Diagnosis not present

## 2023-04-12 DIAGNOSIS — I10 Essential (primary) hypertension: Secondary | ICD-10-CM | POA: Diagnosis not present

## 2023-04-12 DIAGNOSIS — E559 Vitamin D deficiency, unspecified: Secondary | ICD-10-CM | POA: Diagnosis not present

## 2023-04-12 DIAGNOSIS — E1165 Type 2 diabetes mellitus with hyperglycemia: Secondary | ICD-10-CM | POA: Diagnosis not present

## 2023-04-12 DIAGNOSIS — E1159 Type 2 diabetes mellitus with other circulatory complications: Secondary | ICD-10-CM | POA: Diagnosis not present

## 2023-05-14 DIAGNOSIS — Z13220 Encounter for screening for lipoid disorders: Secondary | ICD-10-CM | POA: Diagnosis not present

## 2023-05-14 DIAGNOSIS — Z125 Encounter for screening for malignant neoplasm of prostate: Secondary | ICD-10-CM | POA: Diagnosis not present

## 2023-05-14 DIAGNOSIS — Z114 Encounter for screening for human immunodeficiency virus [HIV]: Secondary | ICD-10-CM | POA: Diagnosis not present

## 2023-05-14 DIAGNOSIS — Z0000 Encounter for general adult medical examination without abnormal findings: Secondary | ICD-10-CM | POA: Diagnosis not present

## 2023-05-21 DIAGNOSIS — Z0000 Encounter for general adult medical examination without abnormal findings: Secondary | ICD-10-CM | POA: Diagnosis not present

## 2023-05-21 DIAGNOSIS — E1165 Type 2 diabetes mellitus with hyperglycemia: Secondary | ICD-10-CM | POA: Diagnosis not present

## 2023-05-21 DIAGNOSIS — I10 Essential (primary) hypertension: Secondary | ICD-10-CM | POA: Diagnosis not present

## 2023-05-21 DIAGNOSIS — E669 Obesity, unspecified: Secondary | ICD-10-CM | POA: Diagnosis not present

## 2023-05-21 DIAGNOSIS — E782 Mixed hyperlipidemia: Secondary | ICD-10-CM | POA: Diagnosis not present

## 2023-06-04 DIAGNOSIS — E782 Mixed hyperlipidemia: Secondary | ICD-10-CM | POA: Diagnosis not present

## 2023-06-04 DIAGNOSIS — R972 Elevated prostate specific antigen [PSA]: Secondary | ICD-10-CM | POA: Diagnosis not present

## 2023-06-04 DIAGNOSIS — E1165 Type 2 diabetes mellitus with hyperglycemia: Secondary | ICD-10-CM | POA: Diagnosis not present

## 2023-06-11 DIAGNOSIS — R972 Elevated prostate specific antigen [PSA]: Secondary | ICD-10-CM | POA: Diagnosis not present

## 2023-06-11 DIAGNOSIS — R350 Frequency of micturition: Secondary | ICD-10-CM | POA: Diagnosis not present

## 2023-08-17 DIAGNOSIS — H6123 Impacted cerumen, bilateral: Secondary | ICD-10-CM | POA: Diagnosis not present

## 2023-08-17 DIAGNOSIS — B9689 Other specified bacterial agents as the cause of diseases classified elsewhere: Secondary | ICD-10-CM | POA: Diagnosis not present

## 2023-08-17 DIAGNOSIS — J069 Acute upper respiratory infection, unspecified: Secondary | ICD-10-CM | POA: Diagnosis not present

## 2023-08-17 DIAGNOSIS — J329 Chronic sinusitis, unspecified: Secondary | ICD-10-CM | POA: Diagnosis not present

## 2023-08-17 DIAGNOSIS — I10 Essential (primary) hypertension: Secondary | ICD-10-CM | POA: Diagnosis not present

## 2023-08-17 DIAGNOSIS — Z1159 Encounter for screening for other viral diseases: Secondary | ICD-10-CM | POA: Diagnosis not present

## 2023-08-28 DIAGNOSIS — E785 Hyperlipidemia, unspecified: Secondary | ICD-10-CM | POA: Diagnosis not present

## 2023-08-28 DIAGNOSIS — R739 Hyperglycemia, unspecified: Secondary | ICD-10-CM | POA: Diagnosis not present

## 2023-09-03 DIAGNOSIS — R972 Elevated prostate specific antigen [PSA]: Secondary | ICD-10-CM | POA: Diagnosis not present

## 2023-09-04 DIAGNOSIS — E782 Mixed hyperlipidemia: Secondary | ICD-10-CM | POA: Diagnosis not present

## 2023-09-04 DIAGNOSIS — E1165 Type 2 diabetes mellitus with hyperglycemia: Secondary | ICD-10-CM | POA: Diagnosis not present

## 2023-09-04 DIAGNOSIS — Z789 Other specified health status: Secondary | ICD-10-CM | POA: Diagnosis not present

## 2023-09-10 DIAGNOSIS — R972 Elevated prostate specific antigen [PSA]: Secondary | ICD-10-CM | POA: Diagnosis not present

## 2023-10-16 DIAGNOSIS — E782 Mixed hyperlipidemia: Secondary | ICD-10-CM | POA: Diagnosis not present

## 2023-10-16 DIAGNOSIS — I10 Essential (primary) hypertension: Secondary | ICD-10-CM | POA: Diagnosis not present

## 2023-10-16 DIAGNOSIS — E1165 Type 2 diabetes mellitus with hyperglycemia: Secondary | ICD-10-CM | POA: Diagnosis not present

## 2023-10-16 DIAGNOSIS — E669 Obesity, unspecified: Secondary | ICD-10-CM | POA: Diagnosis not present

## 2023-11-19 DIAGNOSIS — R972 Elevated prostate specific antigen [PSA]: Secondary | ICD-10-CM | POA: Diagnosis not present

## 2023-12-04 DIAGNOSIS — R739 Hyperglycemia, unspecified: Secondary | ICD-10-CM | POA: Diagnosis not present

## 2023-12-10 DIAGNOSIS — I10 Essential (primary) hypertension: Secondary | ICD-10-CM | POA: Diagnosis not present

## 2023-12-10 DIAGNOSIS — E1121 Type 2 diabetes mellitus with diabetic nephropathy: Secondary | ICD-10-CM | POA: Diagnosis not present

## 2023-12-10 DIAGNOSIS — R972 Elevated prostate specific antigen [PSA]: Secondary | ICD-10-CM | POA: Diagnosis not present

## 2023-12-10 DIAGNOSIS — E1165 Type 2 diabetes mellitus with hyperglycemia: Secondary | ICD-10-CM | POA: Diagnosis not present

## 2023-12-20 ENCOUNTER — Emergency Department (HOSPITAL_BASED_OUTPATIENT_CLINIC_OR_DEPARTMENT_OTHER)
Admission: EM | Admit: 2023-12-20 | Discharge: 2023-12-21 | Disposition: A | Discharge status: Home / Self Care | Attending: Emergency Medicine | Admitting: Emergency Medicine

## 2023-12-20 ENCOUNTER — Other Ambulatory Visit: Payer: Self-pay

## 2023-12-20 ENCOUNTER — Encounter (HOSPITAL_BASED_OUTPATIENT_CLINIC_OR_DEPARTMENT_OTHER): Payer: Self-pay

## 2023-12-20 DIAGNOSIS — R739 Hyperglycemia, unspecified: Secondary | ICD-10-CM | POA: Diagnosis not present

## 2023-12-20 DIAGNOSIS — Z79899 Other long term (current) drug therapy: Secondary | ICD-10-CM | POA: Insufficient documentation

## 2023-12-20 DIAGNOSIS — Z7984 Long term (current) use of oral hypoglycemic drugs: Secondary | ICD-10-CM | POA: Insufficient documentation

## 2023-12-20 DIAGNOSIS — E1165 Type 2 diabetes mellitus with hyperglycemia: Secondary | ICD-10-CM | POA: Insufficient documentation

## 2023-12-20 DIAGNOSIS — I10 Essential (primary) hypertension: Secondary | ICD-10-CM | POA: Diagnosis not present

## 2023-12-20 LAB — CBC WITH DIFFERENTIAL/PLATELET
Abs Immature Granulocytes: 0.04 K/uL (ref 0.00–0.07)
Basophils Absolute: 0.1 K/uL (ref 0.0–0.1)
Basophils Relative: 1 %
Eosinophils Absolute: 0.2 K/uL (ref 0.0–0.5)
Eosinophils Relative: 3 %
HCT: 38.8 % — ABNORMAL LOW (ref 39.0–52.0)
Hemoglobin: 13.7 g/dL (ref 13.0–17.0)
Immature Granulocytes: 1 %
Lymphocytes Relative: 27 %
Lymphs Abs: 2.3 K/uL (ref 0.7–4.0)
MCH: 30.2 pg (ref 26.0–34.0)
MCHC: 35.3 g/dL (ref 30.0–36.0)
MCV: 85.5 fL (ref 80.0–100.0)
Monocytes Absolute: 0.7 K/uL (ref 0.1–1.0)
Monocytes Relative: 8 %
Neutro Abs: 5.1 K/uL (ref 1.7–7.7)
Neutrophils Relative %: 60 %
Platelets: 159 K/uL (ref 150–400)
RBC: 4.54 MIL/uL (ref 4.22–5.81)
RDW: 12.4 % (ref 11.5–15.5)
WBC: 8.4 K/uL (ref 4.0–10.5)
nRBC: 0 % (ref 0.0–0.2)

## 2023-12-20 LAB — I-STAT VENOUS BLOOD GAS, ED
Acid-Base Excess: 0 mmol/L (ref 0.0–2.0)
Bicarbonate: 24.5 mmol/L (ref 20.0–28.0)
Calcium, Ion: 1.18 mmol/L (ref 1.15–1.40)
HCT: 40 % (ref 39.0–52.0)
Hemoglobin: 13.6 g/dL (ref 13.0–17.0)
O2 Saturation: 84 %
Patient temperature: 97.6
Potassium: 4.1 mmol/L (ref 3.5–5.1)
Sodium: 134 mmol/L — ABNORMAL LOW (ref 135–145)
TCO2: 26 mmol/L (ref 22–32)
pCO2, Ven: 37.9 mmHg — ABNORMAL LOW (ref 44–60)
pH, Ven: 7.416 (ref 7.25–7.43)
pO2, Ven: 47 mmHg — ABNORMAL HIGH (ref 32–45)

## 2023-12-20 LAB — BASIC METABOLIC PANEL WITH GFR
Anion gap: 14 (ref 5–15)
BUN: 20 mg/dL (ref 6–20)
CO2: 23 mmol/L (ref 22–32)
Calcium: 9.4 mg/dL (ref 8.9–10.3)
Chloride: 96 mmol/L — ABNORMAL LOW (ref 98–111)
Creatinine, Ser: 1.09 mg/dL (ref 0.61–1.24)
GFR, Estimated: >60 mL/min (ref >60)
Glucose, Bld: 550 mg/dL (ref 70–99)
Potassium: 4.2 mmol/L (ref 3.5–5.1)
Sodium: 133 mmol/L — ABNORMAL LOW (ref 135–145)

## 2023-12-20 LAB — URINALYSIS, ROUTINE W REFLEX MICROSCOPIC
Bilirubin Urine: NEGATIVE
Glucose, UA: >=500 mg/dL — AB
Ketones, ur: NEGATIVE mg/dL
Leukocytes,Ua: NEGATIVE
Nitrite: NEGATIVE
Protein, ur: NEGATIVE mg/dL
Specific Gravity, Urine: 1.01 (ref 1.005–1.030)
pH: 7 (ref 5.0–8.0)

## 2023-12-20 LAB — CBG MONITORING, ED: Glucose-Capillary: 548 mg/dL (ref 70–99)

## 2023-12-20 LAB — URINALYSIS, MICROSCOPIC (REFLEX): WBC, UA: NONE SEEN WBC/hpf (ref 0–5)

## 2023-12-20 MED ORDER — LACTATED RINGERS IV BOLUS
1000.0000 mL | Freq: Once | INTRAVENOUS | Status: AC
  Administered 2023-12-20: 1000 mL via INTRAVENOUS

## 2023-12-20 NOTE — ED Triage Notes (Signed)
 Pt reports increased urination over the last day and took his blood sugar today and it was >600. Pt reports he is supposed to take ozempic and metformin but has not taken either for a while. Pt denies other complaints or pain.

## 2023-12-20 NOTE — ED Provider Notes (Signed)
 [facility] Provider Note   CSN: [id number] Arrival date & time: 12/20/23  2152     Patient presents with: Hyperglycemia   Steven Giles is a 58 y.o. male.   The history is provided by the patient.  Hyperglycemia Steven Giles is a 58 y.o. male who presents to the Emergency Department complaining of elevated blood sugar.  He presents to the emergency department today due to checking his blood sugar at home and noticing that it was greater than 600.  He has urinary frequency, which is an ongoing issue but over the last several days has been very thirsty.  He has no systemic complaints.  No fever, abdominal pain, nausea, vomiting, dysuria.  He has a history of hypertension, diabetes, hyperlipidemia and gout.  He is supposed to be on metformin and Ozempic but has not been taking his medications regularly since December.  He did take his Ozempic today.  He was previously on metformin 1000 mg twice daily.     Prior to Admission medications   Medication Sig Start Date End Date Taking? Authorizing Provider  febuxostat  (ULORIC ) 40 MG tablet Take 40 mg by mouth daily.    [provider]  metFORMIN (GLUCOPHAGE) 500 MG tablet Take 500 mg by mouth 2 (two) times daily. 03/03/19   [provider]  olmesartan (BENICAR) 40 MG tablet Take 40 mg by mouth daily.    [provider]  rosuvastatin (CRESTOR) 10 MG tablet Take 10 mg by mouth daily.    [provider]  Semaglutide, 1 MG/DOSE, (OZEMPIC, 1 MG/DOSE,) 2 MG/1.5ML SOPN Inject into the skin.    [provider]    Allergies: Nasacort [triamcinolone]    Review of Systems  All other systems reviewed and are negative.   Updated Vital Signs BP (!) 139/90   Pulse 63   Temp 97.6 F (36.4 C) (Oral)   Resp 18   Ht 5' 10 (1.778 m)   Wt 110.7 kg   SpO2 95%   BMI 35.01 kg/m   Physical Exam Vitals and nursing note reviewed.  Constitutional:       Appearance: He is well-developed.  HENT:     Head: Normocephalic and atraumatic.  Cardiovascular:     Rate and Rhythm: Normal rate and regular rhythm.  Pulmonary:     Effort: Pulmonary effort is normal. No respiratory distress.  Abdominal:     Palpations: Abdomen is soft.     Tenderness: There is no abdominal tenderness. There is no guarding or rebound.  Musculoskeletal:        General: No tenderness.  Skin:    General: Skin is warm and dry.  Neurological:     Mental Status: He is alert and oriented to person, place, and time.  Psychiatric:        Behavior: Behavior normal.     (all labs ordered are listed, but only abnormal results are displayed) Labs Reviewed  CBC WITH DIFFERENTIAL/PLATELET - Abnormal; Notable for the following components:      Result Value   HCT 38.8 (*)    All other components within normal limits  BASIC METABOLIC PANEL WITH GFR - Abnormal; Notable for the following components:   Sodium 133 (*)    Chloride 96 (*)    Glucose, Bld 550 (*)    All other components within normal limits  URINALYSIS, ROUTINE W REFLEX MICROSCOPIC - Abnormal; Notable for the following components:   Glucose, UA >=500 (*)  Hgb urine dipstick TRACE (*)    All other components within normal limits  URINALYSIS, MICROSCOPIC (REFLEX) - Abnormal; Notable for the following components:   Bacteria, UA RARE (*)    All other components within normal limits  CBG MONITORING, ED - Abnormal; Notable for the following components:   Glucose-Capillary 548 (*)    All other components within normal limits  I-STAT VENOUS BLOOD GAS, ED - Abnormal; Notable for the following components:   pCO2, Ven 37.9 (*)    pO2, Ven 47 (*)    Sodium 134 (*)    All other components within normal limits  CBG MONITORING, ED - Abnormal; Notable for the following components:   Glucose-Capillary 409 (*)    All other components within normal limits    EKG: None  Radiology: No results found.   Procedures    Medications Ordered in the ED  lactated ringers  bolus 1,000 mL (0 mLs Intravenous Stopped 12/21/23 0102)                                    Medical Decision Making  Patient here for evaluation of hyperglycemia, excessive thirst.  He is hyperglycemic today without any significant electrolyte abnormalities.  No evidence of DKA.  He has normal renal function.  There is no evidence of soft tissue infection on foot exam.  He was treated with IV fluids with improvement in his blood sugar.  Discussed resuming his metformin.  Discussed slowly increasing his dose as well as diabetic diet.  Feel he is stable to discharge home with outpatient PCP follow-up and close return precautions.     Final diagnoses:  Hyperglycemia    ED Discharge Orders     None          Griselda Norris, MD 12/21/23 [phone]

## 2023-12-21 DIAGNOSIS — E1165 Type 2 diabetes mellitus with hyperglycemia: Secondary | ICD-10-CM | POA: Diagnosis not present

## 2023-12-21 LAB — CBG MONITORING, ED: Glucose-Capillary: 409 mg/dL — ABNORMAL HIGH (ref 70–99)

## 2023-12-21 NOTE — Discharge Instructions (Signed)
 Take your metformin 500mg  once daily for the next 2-3 days.  Then increased to twice daily, 500mg  for the next 2-3 days.   You may then increase to 1000 mg twice daily if tolerated.

## 2023-12-27 ENCOUNTER — Encounter: Payer: Self-pay | Admitting: Neurology

## 2023-12-27 ENCOUNTER — Ambulatory Visit (INDEPENDENT_AMBULATORY_CARE_PROVIDER_SITE_OTHER): Payer: Federal, State, Local not specified - PPO | Admitting: Neurology

## 2023-12-27 VITALS — BP 128/88 | HR 71 | Resp 15 | Ht 70.0 in

## 2023-12-27 DIAGNOSIS — G4733 Obstructive sleep apnea (adult) (pediatric): Secondary | ICD-10-CM | POA: Diagnosis not present

## 2023-12-27 NOTE — Patient Instructions (Signed)
 Great to see you today! Continue CPAP usage minimum 4 hours nightly Continue current settings Continue to replace supplies routinely through DME Follow-up in 1 year or sooner if needed. Thanks!!

## 2023-12-27 NOTE — Progress Notes (Signed)
 Patient: Steven Giles Date of Birth: 08-Jul-1965  Reason for Visit: Follow up History from: Patient Primary Neurologist: Buck  ASSESSMENT AND PLAN 58 y.o. year old male   1.  OSA on CPAP (long-term, reevaluation HST 09/28/2021 with AHI 49.1/h, O2 nadir 88%.  Started new AutoPap 10/27/2021)  - Doing great with CPAP.  Encouraged to continue nightly use for minimum 4 hours.  We will continue current settings.  I will send an order to his DME to continue to send supplies as requested.  He has no issues with the CPAP or mask.  We will follow-up in 1 year or sooner if needed.  Orders Placed This Encounter  Procedures   For home use only DME continuous positive airway pressure (CPAP)   HISTORY OF PRESENT ILLNESS: Today 12/27/23 12/27/23 SS: Here for CPAP. 100% compliance, 6-12 cm water, leak 3.7, AHI 1.6. Feeling rested during the day. Working full time, works in [employer]. Prostate enlargement, having to get up at night to use the bathroom, seeing urology. Uses nasal mask for CPAP. Routinely replaces supplies. Working on DM, glucose > 600, need to get back on Ozempic and metformin, wearing continuous glucose monitor.   12/21/22 SS: Here for CPAP. Using nasal mask. Has not noted much subjective change in using CPAP. He was using an old CPAP machine for 20 years before he got the new one in May 2023. With his old machine he felt he got more pressure. [place] [employer] [place] ESS 6.  CPAP data 11/21/2022-12/20/22 shows superb compliance 100% daily with greater than 4-hour usage.  Average usage 6 hours 56 minutes. 6-12 centimeters water.  Leak 2.5.  AHI 1.0.  No issues with the machine or equipment.  HISTORY  12/20/21 Dr. Buck: Steven Giles is a 58 year old right-handed gentleman with an underlying medical history of diabetes, hypertension, gout, anxiety, depression, and obesity, who presents for follow-up consultation of his obstructive sleep apnea after interim reevaluation  with home sleep testing and starting therapy with a new AutoPap machine.  The patient is unaccompanied today.  I first met him at the request of his primary care physician on 08/03/2021, at which time he reported a prior diagnosis of obstructive sleep apnea.  He had an old CPAP machine.  He was advised to proceed with re-evaluation.   He had a home sleep test on 09/28/21, which showed severe obstructive sleep apnea - by number of events - with a total AHI of 49.1/hour and O2 nadir of 88%.  Snoring ranged from mild to loud.  Continue AutoPap machine.  His set up date was 10/24/2021, he has a ResMed air sense 11 AutoSet machine.   Today, 12/20/2021: I reviewed his AutoPap compliance data from 11/19/2021 through 12/18/2021, which is a total of 30 days, during which time he used his machine every night with percent use days greater than 4 hours at 100%, indicating superb compliance, average usage of 7 hours and 27 minutes, residual AHI at goal at 1.7/h, average pressure for the 95th percentile at 8.6 cm with a range of 6 to 12 cm, leak on the lower side with the 95th percentile at 4.8 L/min.  He reports doing well with his new machine, he benefits from treatment and is fully compliant with it.  He uses a nasal mask.  He has had no recent changes to his medical history, Ozempic is working well and has reduced his A1c.  He has had some weight  loss as well but attributes this in part to the stress of a new job.  He works for the [employer] and runs an office in the town of [place] (no relationship to his own last name, as far as he knows).  REVIEW OF SYSTEMS: Out of a complete 14 system review of symptoms, the patient complains only of the following symptoms, and all other reviewed systems are negative.  See HPI  ALLERGIES: Allergies  Allergen Reactions   Nasacort [Triamcinolone] Hives    Nasacort     HOME MEDICATIONS: Outpatient Medications Prior to Visit  Medication Sig Dispense Refill   febuxostat  (ULORIC )  40 MG tablet Take 40 mg by mouth daily.     finasteride (PROSCAR) 5 MG tablet Take 5 mg by mouth daily.     metFORMIN (GLUCOPHAGE) 1000 MG tablet Take 1,000 mg by mouth 2 (two) times daily.     montelukast (SINGULAIR) 10 MG tablet Take 10 mg by mouth daily.     olmesartan-hydrochlorothiazide  (BENICAR HCT) 40-25 MG tablet Take 1 tablet by mouth daily.     rosuvastatin (CRESTOR) 10 MG tablet Take 10 mg by mouth daily.     Semaglutide, 1 MG/DOSE, (OZEMPIC, 1 MG/DOSE,) 2 MG/1.5ML SOPN Inject into the skin.     metFORMIN (GLUCOPHAGE) 500 MG tablet Take 500 mg by mouth 2 (two) times daily.     olmesartan (BENICAR) 40 MG tablet Take 40 mg by mouth daily.     No facility-administered medications prior to visit.    PAST MEDICAL HISTORY: Past Medical History:  Diagnosis Date   Diabetes mellitus without complication (HCC)    Gout    H/O exercise stress test 2012   normal   Hypertension    Obstructive sleep apnea on CPAP    Sleep apnea     PAST SURGICAL HISTORY: Past Surgical History:  Procedure Laterality Date   NO PAST SURGERIES      FAMILY HISTORY: Family History  Problem Relation Age of Onset   Heart failure Father        mini-stroke?   Heart attack Father    Diabetes Maternal Grandmother    Heart Problems Maternal Grandmother    Heart failure Maternal Grandmother    Healthy Mother    Healthy Brother    Heart attack Maternal Grandfather    Stroke Maternal Grandfather    Colon cancer Neg Hx    Colon polyps Neg Hx    Esophageal cancer Neg Hx    Rectal cancer Neg Hx    Stomach cancer Neg Hx     SOCIAL HISTORY: Social History   Socioeconomic History   Marital status: Married    Spouse name: Not on file   Number of children: 1   Years of education: 12   Highest education level: Not on file  Occupational History   Occupation: Research officer, trade union: OTHER    Comment: Civil engineer, contracting  Tobacco Use   Smoking status: Never   Smokeless tobacco: Never  Vaping Use    Vaping status: Never Used  Substance and Sexual Activity   Alcohol use: Yes    Alcohol/week: 3.0 standard drinks of alcohol    Types: 3 Cans of beer per week   Drug use: No   Sexual activity: Yes    Birth control/protection: None  Other Topics Concern   Not on file  Social History Narrative   Left handed   Caffeine- 5 -6 cups per day    Social Drivers  of Health   Financial Resource Strain: Not on file  Food Insecurity: Not on file  Transportation Needs: Not on file  Physical Activity: Not on file  Stress: Not on file  Social Connections: Not on file  Intimate Partner Violence: Not on file   PHYSICAL EXAM  Vitals:   12/27/23 0746  BP: 128/88  Pulse: 71  Resp: 15  SpO2: 93%  Height: 5' 10 (1.778 m)    Body mass index is 35.01 kg/m.  Generalized: Well developed, in no acute distress  Neurological examination  Mentation: Alert oriented to time, place, history taking. Follows all commands speech and language fluent Cranial nerve II-XII: Pupils were equal round reactive to light. Extraocular movements were full, visual field were full on confrontational test. Facial sensation and strength were normal.  Head turning and shoulder shrug  were normal and symmetric. Motor: The motor testing reveals 5 over 5 strength of all 4 extremities. Good symmetric motor tone is noted throughout.  Sensory: Sensory testing is intact to soft touch on all 4 extremities. No evidence of extinction is noted.  Gait and station: Gait is normal.    DIAGNOSTIC DATA (LABS, IMAGING, TESTING) - I reviewed patient records, labs, notes, testing and imaging myself where available.  Lab Results  Component Value Date   WBC 8.4 12/20/2023   HGB 13.6 12/20/2023   HCT 40.0 12/20/2023   MCV 85.5 12/20/2023   PLT 159 12/20/2023      Component Value Date/Time   NA 134 (L) 12/20/2023 2216   K 4.1 12/20/2023 2216   CL 96 (L) 12/20/2023 2211   CO2 23 12/20/2023 2211   GLUCOSE 550 (HH) 12/20/2023 2211    BUN 20 12/20/2023 2211   CREATININE 1.09 12/20/2023 2211   CALCIUM 9.4 12/20/2023 2211   PROT 7.2 06/22/2019 0655   ALBUMIN 3.7 06/22/2019 0655   AST 31 06/22/2019 0655   ALT 37 06/22/2019 0655   ALKPHOS 54 06/22/2019 0655   BILITOT 1.0 06/22/2019 0655   GFRNONAA >60 12/20/2023 2211   GFRAA >60 06/22/2019 0655   Lab Results  Component Value Date   TRIG 193 (H) 06/19/2019   Lab Results  Component Value Date   HGBA1C 7.1 (H) 06/20/2019   No results found for: VITAMINB12 No results found for: TSH  Lauraine Born, AGNP-C, DNP 12/27/2023, 8:06 AM [facility] [address], [address] [place], [place] [address] [phone]

## 2024-01-15 DIAGNOSIS — M109 Gout, unspecified: Secondary | ICD-10-CM | POA: Diagnosis not present

## 2024-01-15 DIAGNOSIS — E1165 Type 2 diabetes mellitus with hyperglycemia: Secondary | ICD-10-CM | POA: Diagnosis not present

## 2024-01-15 DIAGNOSIS — I10 Essential (primary) hypertension: Secondary | ICD-10-CM | POA: Diagnosis not present

## 2024-01-21 DIAGNOSIS — I10 Essential (primary) hypertension: Secondary | ICD-10-CM | POA: Diagnosis not present

## 2024-01-21 DIAGNOSIS — G43109 Migraine with aura, not intractable, without status migrainosus: Secondary | ICD-10-CM | POA: Diagnosis not present

## 2024-01-21 DIAGNOSIS — E1165 Type 2 diabetes mellitus with hyperglycemia: Secondary | ICD-10-CM | POA: Diagnosis not present

## 2024-02-09 DIAGNOSIS — M791 Myalgia, unspecified site: Secondary | ICD-10-CM | POA: Diagnosis not present

## 2024-02-09 DIAGNOSIS — M79605 Pain in left leg: Secondary | ICD-10-CM | POA: Diagnosis not present

## 2024-02-12 DIAGNOSIS — I129 Hypertensive chronic kidney disease with stage 1 through stage 4 chronic kidney disease, or unspecified chronic kidney disease: Secondary | ICD-10-CM | POA: Diagnosis not present

## 2024-02-12 DIAGNOSIS — Z7985 Long-term (current) use of injectable non-insulin antidiabetic drugs: Secondary | ICD-10-CM | POA: Diagnosis not present

## 2024-02-12 DIAGNOSIS — M109 Gout, unspecified: Secondary | ICD-10-CM | POA: Diagnosis not present

## 2024-02-12 DIAGNOSIS — E1165 Type 2 diabetes mellitus with hyperglycemia: Secondary | ICD-10-CM | POA: Diagnosis not present

## 2024-02-12 DIAGNOSIS — M1712 Unilateral primary osteoarthritis, left knee: Secondary | ICD-10-CM | POA: Diagnosis not present

## 2024-02-12 DIAGNOSIS — G43109 Migraine with aura, not intractable, without status migrainosus: Secondary | ICD-10-CM | POA: Diagnosis not present

## 2024-03-13 ENCOUNTER — Encounter: Payer: Self-pay | Admitting: *Deleted

## 2024-03-13 DIAGNOSIS — R0981 Nasal congestion: Secondary | ICD-10-CM | POA: Diagnosis not present

## 2024-03-13 DIAGNOSIS — R051 Acute cough: Secondary | ICD-10-CM | POA: Diagnosis not present

## 2024-03-13 DIAGNOSIS — R0982 Postnasal drip: Secondary | ICD-10-CM | POA: Diagnosis not present

## 2024-03-13 DIAGNOSIS — O864 Pyrexia of unknown origin following delivery: Secondary | ICD-10-CM | POA: Diagnosis not present

## 2024-03-13 NOTE — Progress Notes (Signed)
 JOAHAN SWATZELL                                          MRN: [id number]   03/13/2024   The [facility] Quality Team Specialist reviewed this patient medical record for the purposes of chart review for care gap closure. The following were reviewed: chart review for care gap closure-kidney health evaluation for diabetes:eGFR  and uACR.    [facility] Quality Team

## 2024-03-25 DIAGNOSIS — R0982 Postnasal drip: Secondary | ICD-10-CM | POA: Diagnosis not present

## 2024-03-25 DIAGNOSIS — J018 Other acute sinusitis: Secondary | ICD-10-CM | POA: Diagnosis not present

## 2024-03-25 DIAGNOSIS — R0981 Nasal congestion: Secondary | ICD-10-CM | POA: Diagnosis not present

## 2024-07-02 ENCOUNTER — Telehealth: Payer: Self-pay | Admitting: Neurology

## 2024-07-02 NOTE — Telephone Encounter (Signed)
 MYC cxl

## 2025-01-01 ENCOUNTER — Ambulatory Visit: Admitting: Neurology
